# Patient Record
Sex: Female | Born: 1995 | Race: White | Hispanic: No | Marital: Single | Smoking: Never smoker
Health system: Southern US, Community
[De-identification: ages and names within clinical notes are randomized; demographics above are authoritative.]

## PROBLEM LIST (undated history)

## (undated) DIAGNOSIS — T7840XA Allergy, unspecified, initial encounter: Secondary | ICD-10-CM

## (undated) DIAGNOSIS — N12 Tubulo-interstitial nephritis, not specified as acute or chronic: Secondary | ICD-10-CM

## (undated) DIAGNOSIS — N390 Urinary tract infection, site not specified: Secondary | ICD-10-CM

## (undated) HISTORY — DX: Allergy, unspecified, initial encounter: T78.40XA

## (undated) HISTORY — DX: Tubulo-interstitial nephritis, not specified as acute or chronic: N12

## (undated) HISTORY — DX: Urinary tract infection, site not specified: N39.0

---

## 2014-07-22 ENCOUNTER — Emergency Department (HOSPITAL_COMMUNITY)
Admission: EM | Admit: 2014-07-22 | Discharge: 2014-07-22 | Disposition: A | Discharge status: Home / Self Care | Payer: BLUE CROSS/BLUE SHIELD | Source: Home / Self Care | Attending: Emergency Medicine | Admitting: Emergency Medicine

## 2014-07-22 ENCOUNTER — Encounter (HOSPITAL_COMMUNITY): Payer: Self-pay

## 2014-07-22 DIAGNOSIS — N12 Tubulo-interstitial nephritis, not specified as acute or chronic: Secondary | ICD-10-CM

## 2014-07-22 LAB — POCT URINALYSIS DIP (DEVICE)
Bilirubin Urine: NEGATIVE
GLUCOSE, UA: NEGATIVE mg/dL
KETONES UR: 40 mg/dL — AB
NITRITE: POSITIVE — AB
PH: 5.5 (ref 5.0–8.0)
Protein, ur: 30 mg/dL — AB
Specific Gravity, Urine: 1.025 (ref 1.005–1.030)
Urobilinogen, UA: 0.2 mg/dL (ref 0.0–1.0)

## 2014-07-22 MED ORDER — CIPROFLOXACIN HCL 500 MG PO TABS: 500.0000 mg | ORAL_TABLET | Freq: Two times a day (BID) | ORAL | Status: DC

## 2014-07-22 MED ORDER — LIDOCAINE HCL (PF) 2 % IJ SOLN
INTRAMUSCULAR | Status: AC
  Filled 2014-07-22: qty 2

## 2014-07-22 MED ORDER — CEFTRIAXONE SODIUM 1 G IJ SOLR
1.0000 g | Freq: Once | INTRAMUSCULAR | Status: AC
  Administered 2014-07-22: 1 g via INTRAMUSCULAR

## 2014-07-22 MED ORDER — TRAMADOL HCL 50 MG PO TABS: 50.0000 mg | ORAL_TABLET | Freq: Four times a day (QID) | ORAL | Status: DC | PRN

## 2014-07-22 MED ORDER — CEFTRIAXONE SODIUM 1 G IJ SOLR
INTRAMUSCULAR | Status: AC
  Filled 2014-07-22: qty 10

## 2014-07-22 NOTE — Discharge Instructions (Signed)
Pyelonephritis, Adult °Pyelonephritis is a kidney infection. In general, there are 2 main types of pyelonephritis: °· Infections that come on quickly without any warning (acute pyelonephritis). °· Infections that persist for a long period of time (chronic pyelonephritis). °CAUSES  °Two main causes of pyelonephritis are: °· Bacteria traveling from the bladder to the kidney. This is a problem especially in pregnant women. The urine in the bladder can become filled with bacteria from multiple causes, including: °¨ Inflammation of the prostate gland (prostatitis). °¨ Sexual intercourse in females. °¨ Bladder infection (cystitis). °· Bacteria traveling from the bloodstream to the tissue part of the kidney. °Problems that may increase your risk of getting a kidney infection include: °· Diabetes. °· Kidney stones or bladder stones. °· Cancer. °· Catheters placed in the bladder. °· Other abnormalities of the kidney or ureter. °SYMPTOMS  °· Abdominal pain. °· Pain in the side or flank area. °· Fever. °· Chills. °· Upset stomach. °· Blood in the urine (dark urine). °· Frequent urination. °· Strong or persistent urge to urinate. °· Burning or stinging when urinating. °DIAGNOSIS  °Your caregiver may diagnose your kidney infection based on your symptoms. A urine sample may also be taken. °TREATMENT  °In general, treatment depends on how severe the infection is.  °· If the infection is mild and caught early, your caregiver may treat you with oral antibiotics and send you home. °· If the infection is more severe, the bacteria may have gotten into the bloodstream. This will require intravenous (IV) antibiotics and a hospital stay. Symptoms may include: °¨ High fever. °¨ Severe flank pain. °¨ Shaking chills. °· Even after a hospital stay, your caregiver may require you to be on oral antibiotics for a period of time. °· Other treatments may be required depending upon the cause of the infection. °HOME CARE INSTRUCTIONS  °· Take your  antibiotics as directed. Finish them even if you start to feel better. °· Make an appointment to have your urine checked to make sure the infection is gone. °· Drink enough fluids to keep your urine clear or pale yellow. °· Take medicines for the bladder if you have urgency and frequency of urination as directed by your caregiver. °SEEK IMMEDIATE MEDICAL CARE IF:  °· You have a fever or persistent symptoms for more than 2-3 days. °· You have a fever and your symptoms suddenly get worse. °· You are unable to take your antibiotics or fluids. °· You develop shaking chills. °· You experience extreme weakness or fainting. °· There is no improvement after 2 days of treatment. °MAKE SURE YOU: °· Understand these instructions. °· Will watch your condition. °· Will get help right away if you are not doing well or get worse. °Document Released: 05/04/2005 Document Revised: 11/03/2011 Document Reviewed: 10/08/2010 °ExitCare® Patient Information ©2015 [employer]. This information is not intended to replace advice given to you by your health care provider. Make sure you discuss any questions you have with your health care provider. ° °

## 2014-07-22 NOTE — ED Provider Notes (Signed)
CSN: [id number]     Arrival date & time 07/22/14  1511 History   First MD Initiated Contact with Patient 07/22/14 1639     Chief Complaint  Patient presents with  . Abdominal Pain   (Consider location/radiation/quality/duration/timing/severity/associated sxs/prior Treatment) HPI        19 year old female presents complaining of urinary frequency, headache, severe back pain, abdominal pain. This started as dysuria about 2 weeks ago. This got better after a few days. One week ago she started to have abdominal and back pain that has worsened up until today. The pain is in her lower abdomen and on the left side, radiating up into her left flank. She also has diffuse myalgias. She has mild nausea without vomiting. She has never had this before and has no history of UTI or pyelonephritis.  History reviewed. No pertinent past medical history. History reviewed. No pertinent past surgical history. No family history on file. History  Substance Use Topics  . Smoking status: Not on file  . Smokeless tobacco: Not on file  . Alcohol Use: Not on file   OB History    No data available     Review of Systems  Constitutional: Positive for chills.  HENT: Negative for congestion, sinus pressure and sore throat.   Eyes: Negative for visual disturbance.  Respiratory: Negative for chest tightness and shortness of breath.   Cardiovascular: Negative for chest pain.  Gastrointestinal: Positive for abdominal pain. Negative for nausea, vomiting and diarrhea.  Genitourinary: Positive for urgency, frequency and flank pain. Negative for dysuria, hematuria, vaginal bleeding and vaginal discharge.  Musculoskeletal: Positive for myalgias.  Skin: Negative for rash.  All other systems reviewed and are negative.   Allergies  Review of patient's allergies indicates no known allergies.  Home Medications   Prior to Admission medications   Medication Sig Start Date End Date Taking? Authorizing Provider   ciprofloxacin (CIPRO) 500 MG tablet Take 1 tablet (500 mg total) by mouth every 12 (twelve) hours. 07/22/14   Graylon GoodZachary H Pang Robers, PA-C  traMADol (ULTRAM) 50 MG tablet Take 1 tablet (50 mg total) by mouth every 6 (six) hours as needed for moderate pain. 07/22/14   Adrian BlackwaterZachary H Elzabeth Mcquerry, PA-C   BP 109/69 mmHg  Pulse 87  Temp(Src) 98.1 F (36.7 C) (Oral)  Resp 16  SpO2 100% Physical Exam  Constitutional: She is oriented to person, place, and time. Vital signs are normal. She appears well-developed and well-nourished. No distress.  HENT:  Head: Normocephalic and atraumatic.  Cardiovascular: Normal rate, regular rhythm and normal heart sounds.   Pulmonary/Chest: Effort normal and breath sounds normal. No respiratory distress.  Abdominal: Soft. Normal appearance. She exhibits no mass. There is no hepatosplenomegaly. There is tenderness in the suprapubic area and left lower quadrant. There is no rigidity, no rebound, no guarding and no CVA tenderness.  Neurological: She is alert and oriented to person, place, and time. She has normal strength. Coordination normal.  Skin: Skin is warm and dry. No rash noted. She is not diaphoretic.  Psychiatric: She has a normal mood and affect. Judgment normal.  Nursing note and vitals reviewed.   ED Course  Procedures (including critical care time) Labs Review Labs Reviewed  POCT URINALYSIS DIP (DEVICE) - Abnormal; Notable for the following:    Ketones, ur 40 (*)    Hgb urine dipstick MODERATE (*)    Protein, ur 30 (*)    Nitrite POSITIVE (*)    Leukocytes, UA MODERATE (*)    All  other components within normal limits  URINE CULTURE    Imaging Review No results found.   MDM   1. Pyelonephritis    She has pyelonephritis. She has been given an injection of 1 g of ceftriaxone here and discharged with Cipro 500 mg twice a day for 10 days as well as tramadol for pain. She will go to the emergency department if worsening. A urine culture has been sent  Meds  ordered this encounter  Medications  . cefTRIAXone (ROCEPHIN) injection 1 g    Sig:   . ciprofloxacin (CIPRO) 500 MG tablet    Sig: Take 1 tablet (500 mg total) by mouth every 12 (twelve) hours.    Dispense:  20 tablet    Refill:  0    Order Specific Question:  Supervising Provider    Answer:  Linna Hoff [phone]  . traMADol (ULTRAM) 50 MG tablet    Sig: Take 1 tablet (50 mg total) by mouth every 6 (six) hours as needed for moderate pain.    Dispense:  15 tablet    Refill:  0    Order Specific Question:  Supervising Provider    Answer:  Bradd Canary D [[id number]]       Graylon Good, PA-C 07/22/14 [phone]

## 2014-07-22 NOTE — ED Notes (Signed)
Patient complains of abd pain-Stabbing pain that started about a week ago Along with frequent urination Complains of headache, and back pain as well

## 2014-07-25 LAB — URINE CULTURE: Colony Count: >=100000

## 2014-07-30 NOTE — ED Notes (Signed)
Urine culture: >100,000 colonies E. Coli.  Pt. adequately treated with Cipro. Vassie MoselleYork, Jhoel Stieg M 07/30/2014

## 2015-12-16 LAB — HM PAP SMEAR: HM PAP: NORMAL

## 2016-06-18 ENCOUNTER — Telehealth: Payer: Self-pay | Admitting: *Deleted

## 2016-06-18 ENCOUNTER — Telehealth: Payer: Self-pay | Admitting: Physician Assistant

## 2016-06-18 ENCOUNTER — Encounter: Payer: Self-pay | Admitting: Physician Assistant

## 2016-06-18 ENCOUNTER — Other Ambulatory Visit: Payer: Self-pay | Admitting: Physician Assistant

## 2016-06-18 ENCOUNTER — Ambulatory Visit (INDEPENDENT_AMBULATORY_CARE_PROVIDER_SITE_OTHER): Payer: BLUE CROSS/BLUE SHIELD | Admitting: Physician Assistant

## 2016-06-18 ENCOUNTER — Ambulatory Visit (INDEPENDENT_AMBULATORY_CARE_PROVIDER_SITE_OTHER): Payer: BLUE CROSS/BLUE SHIELD

## 2016-06-18 VITALS — BP 98/70 | HR 88 | Temp 99.2°F | Resp 18 | Ht 60.5 in | Wt 120.4 lb

## 2016-06-18 DIAGNOSIS — R05 Cough: Secondary | ICD-10-CM

## 2016-06-18 DIAGNOSIS — R059 Cough, unspecified: Secondary | ICD-10-CM

## 2016-06-18 DIAGNOSIS — Z0000 Encounter for general adult medical examination without abnormal findings: Secondary | ICD-10-CM

## 2016-06-18 DIAGNOSIS — Z0001 Encounter for general adult medical examination with abnormal findings: Secondary | ICD-10-CM

## 2016-06-18 DIAGNOSIS — J45991 Cough variant asthma: Secondary | ICD-10-CM | POA: Insufficient documentation

## 2016-06-18 LAB — COMPREHENSIVE METABOLIC PANEL
ALK PHOS: 33 U/L — AB (ref 39–117)
ALT: 12 U/L (ref 0–35)
AST: 15 U/L (ref 0–37)
Albumin: 4.1 g/dL (ref 3.5–5.2)
BILIRUBIN TOTAL: 0.7 mg/dL (ref 0.2–1.2)
BUN: 10 mg/dL (ref 6–23)
CO2: 27 mEq/L (ref 19–32)
Calcium: 9.2 mg/dL (ref 8.4–10.5)
Chloride: 105 mEq/L (ref 96–112)
Creatinine, Ser: 0.58 mg/dL (ref 0.40–1.20)
GFR: 138.58 mL/min (ref >60.00)
Glucose, Bld: 99 mg/dL (ref 70–99)
Potassium: 3.5 mEq/L (ref 3.5–5.1)
Sodium: 138 mEq/L (ref 135–145)
TOTAL PROTEIN: 7.5 g/dL (ref 6.0–8.3)

## 2016-06-18 LAB — CBC WITH DIFFERENTIAL/PLATELET
BASOS ABS: 0 10*3/uL (ref 0.0–0.1)
Basophils Relative: 0.6 % (ref 0.0–3.0)
EOS PCT: 2 % (ref 0.0–5.0)
Eosinophils Absolute: 0.1 10*3/uL (ref 0.0–0.7)
HCT: 39.5 % (ref 36.0–46.0)
Hemoglobin: 13.4 g/dL (ref 12.0–15.0)
Lymphocytes Relative: 48.4 % — ABNORMAL HIGH (ref 12.0–46.0)
Lymphs Abs: 3.4 10*3/uL (ref 0.7–4.0)
MCHC: 33.8 g/dL (ref 30.0–36.0)
MCV: 90.9 fl (ref 78.0–100.0)
Monocytes Absolute: 0.3 10*3/uL (ref 0.1–1.0)
Monocytes Relative: 5 % (ref 3.0–12.0)
NEUTROS ABS: 3.1 10*3/uL (ref 1.4–7.7)
Neutrophils Relative %: 44 % (ref 43.0–77.0)
Platelets: 225 10*3/uL (ref 150.0–400.0)
RBC: 4.35 Mil/uL (ref 3.87–5.11)
RDW: 13.4 % (ref 11.5–15.5)
WBC: 7 10*3/uL (ref 4.0–10.5)

## 2016-06-18 LAB — LIPID PANEL
Cholesterol: 191 mg/dL (ref 0–200)
HDL: 46.7 mg/dL (ref >39.00)
LDL Cholesterol: 119 mg/dL — ABNORMAL HIGH (ref 0–99)
NonHDL: 144.68
Total CHOL/HDL Ratio: 4
Triglycerides: 130 mg/dL (ref 0.0–149.0)
VLDL: 26 mg/dL (ref 0.0–40.0)

## 2016-06-18 MED ORDER — OMEPRAZOLE 40 MG PO CPDR: 40.0000 mg | DELAYED_RELEASE_CAPSULE | Freq: Every day | ORAL | 0 refills | Status: DC

## 2016-06-18 MED ORDER — FLUTICASONE PROPIONATE 50 MCG/ACT NA SUSP: 2.0000 | Freq: Every day | NASAL | 1 refills | Status: DC

## 2016-06-18 MED ORDER — BECLOMETHASONE DIPROPIONATE 40 MCG/ACT IN AERS: 1.0000 | INHALATION_SPRAY | Freq: Two times a day (BID) | RESPIRATORY_TRACT | 1 refills | Status: DC

## 2016-06-18 NOTE — Telephone Encounter (Signed)
Paperwork received from patient and [facility] ([phone]) and [facility] ([phone]).

## 2016-06-18 NOTE — Progress Notes (Signed)
Pre visit review using our clinic review tool, if applicable. No additional management support is needed unless otherwise documented below in the visit note. 

## 2016-06-18 NOTE — Telephone Encounter (Signed)
Pt called and had questions about medications that she was started on.  Went over medications with pt. Pt verbalized understanding.

## 2016-06-18 NOTE — Patient Instructions (Addendum)
It was great meeting you today!  Please go to the lab to complete your labwork and chest xray.  We will call you with results.  Follow-up with me in two weeks for your cough, sooner if your cough worsens or you develop fever/chills or other worsening symptoms. Take the omeprazole daily before meals. Use the nasal spray as directed.   Cough, Adult Coughing is a reflex that clears your throat and your airways. Coughing helps to heal and protect your lungs. It is normal to cough occasionally, but a cough that happens with other symptoms or lasts a long time may be a sign of a condition that needs treatment. A cough may last only 2-3 weeks (acute), or it may last longer than 8 weeks (chronic). What are the causes? Coughing is commonly caused by:  Breathing in substances that irritate your lungs.  A viral or bacterial respiratory infection.  Allergies.  Asthma.  Postnasal drip.  Smoking.  Acid backing up from the stomach into the esophagus (gastroesophageal reflux).  Certain medicines.  Chronic lung problems, including COPD (or rarely, lung cancer).  Other medical conditions such as heart failure. Follow these instructions at home: Pay attention to any changes in your symptoms. Take these actions to help with your discomfort:  Take medicines only as told by your health care provider.  If you were prescribed an antibiotic medicine, take it as told by your health care provider. Do not stop taking the antibiotic even if you start to feel better.  Talk with your health care provider before you take a cough suppressant medicine.  Drink enough fluid to keep your urine clear or pale yellow.  If the air is dry, use a cold steam vaporizer or humidifier in your bedroom or your home to help loosen secretions.  Avoid anything that causes you to cough at work or at home.  If your cough is worse at night, try sleeping in a semi-upright position.  Avoid cigarette smoke. If you smoke, quit  smoking. If you need help quitting, ask your health care provider.  Avoid caffeine.  Avoid alcohol.  Rest as needed. Contact a health care provider if:  You have new symptoms.  You cough up pus.  Your cough does not get better after 2-3 weeks, or your cough gets worse.  You cannot control your cough with suppressant medicines and you are losing sleep.  You develop pain that is getting worse or pain that is not controlled with pain medicines.  You have a fever.  You have unexplained weight loss.  You have night sweats. Get help right away if:  You cough up blood.  You have difficulty breathing.  Your heartbeat is very fast. This information is not intended to replace advice given to you by your health care provider. Make sure you discuss any questions you have with your health care provider. Document Released: 10/31/2010 Document Revised: 10/10/2015 Document Reviewed: 07/11/2014 Elsevier Interactive Patient Education  2017 [employer]

## 2016-06-18 NOTE — Progress Notes (Signed)
Subjective:    Patient ID: Hannah Donaldson, female    DOB: [birth year], 21 y.o.   MRN: [id number]  HPI  Hannah Donaldson is a 21 y/o female who presents to clinic today to establish care.  Acute Concerns: Cough - on and off for years; since [employer] School. Mostly at night time, but now she finds that it is now occurring during the day. She has had some work-up at her pediatrics office for this, and over the course of her work-up she recollects trying a medication for asthma, allergy, and GERD. She said that she even went to ENT for this, and they started her on zyrtec and told her if it didn't improve she may need a chest xray. She states that she did not follow up with them. She does state that she did not take the medicines as prescribed because she was younger and didn't really make a priority to do so. She endorses a mostly dry cough, but it can be mucuosy at times. She denies shortness of breath with exercise. No night sweats, no fevers. No specific triggers, she denies specific allergens, cold weather, exercise.  Chronic Issues: none  Health Maintenance: Immunizations -- says she us up to date, we have requested records PAP -- has a gynecologist, has been using NuvaRing for four months, states that it is working well for her, periods last about 4 days and are much lighter now that she is on NuvaRing Diet -- doesn't really follow any certain plan, eats whatever she likes Exercise -- go to gym occasionally, works with dogs on the weekend Vision - went last year, wears glasses Dentist - went a few weeks ago  Other providers/specialists: Theatre managerGynecologist - [facility] Tr[facility]  Current wt: 120, normal for her   Review of Systems  Constitutional: Negative for appetite change and fever.  HENT: Negative for ear pain and sinus pressure.   Eyes: Negative for visual disturbance.  Respiratory: Negative for chest tightness and shortness of breath.   Cardiovascular: Negative for  chest pain and leg swelling.  Gastrointestinal: Negative for diarrhea, nausea and vomiting.  Genitourinary: Negative for pelvic pain and vaginal discharge.  Musculoskeletal: Negative for myalgias and neck pain.  Neurological: Negative for dizziness, numbness and headaches.  Hematological: Negative for adenopathy.  Psychiatric/Behavioral:       No issues with depression or anxiety     Past Medical History:  Diagnosis Date  . Allergy    Seasonal     Social History   Social History  . Marital status: Single    Spouse name: N/A  . Number of children: N/A  . Years of education: N/A   Occupational History  . Not on file.   Social History Main Topics  . Smoking status: Never Smoker  . Smokeless tobacco: Never Used  . Alcohol use 0.6 - 1.2 oz/week    1 - 2 Shots of liquor per week     Comment: once a month  . Drug use: No  . Sexual activity: Yes    Birth control/ protection: Inserts     Comment: Nuvaring   Other Topics Concern  . Not on file   Social History Narrative  . No narrative on file    History reviewed. No pertinent surgical history.  History reviewed. No pertinent family history.  No Known Allergies  No current outpatient prescriptions on file prior to visit.   No current facility-administered medications on file prior to visit.  BP 98/70 (BP Location: Left Arm, Patient Position: Sitting, Cuff Size: Normal)   Pulse 88   Temp 99.2 F (37.3 C) (Oral)   Resp 18   Ht 5' 0.5" (1.537 m)   Wt 120 lb 6.1 oz (54.6 kg)   LMP 05/28/2016 (Exact Date)   SpO2 98%   BMI 23.12 kg/m       Objective:   Physical Exam  Constitutional: Vital signs are normal. She is cooperative.  HENT:  Head: Normocephalic and atraumatic.  Right Ear: Tympanic membrane, external ear and ear canal normal. Tympanic membrane is not erythematous, not retracted and not bulging.  Left Ear: Tympanic membrane, external ear and ear canal normal. Tympanic membrane is not erythematous,  not retracted and not bulging.  Nose: Nose normal. Right sinus exhibits no maxillary sinus tenderness and no frontal sinus tenderness. Left sinus exhibits no maxillary sinus tenderness and no frontal sinus tenderness.  Mouth/Throat: Uvula is midline and oropharynx is clear and moist. No posterior oropharyngeal edema or posterior oropharyngeal erythema.  Cardiovascular: Normal rate, regular rhythm, normal heart sounds and normal pulses.   Pulmonary/Chest: Effort normal and breath sounds normal. No accessory muscle usage. No respiratory distress. She has no decreased breath sounds. She has no wheezes. She has no rhonchi. She has no rales.  Abdominal: Normal appearance. There is no tenderness.  Genitourinary:  Genitourinary Comments: Deferred, has Ob-Gyn  Lymphadenopathy:    She has no cervical adenopathy.  Neurological: She is alert. No cranial nerve deficit or sensory deficit. GCS eye subscore is 4. GCS verbal subscore is 5. GCS motor subscore is 6.  Skin: Skin is warm, dry and intact.  Psychiatric: She has a normal mood and affect.  Nursing note and vitals reviewed.     Assessment & Plan:   Problem List Items Addressed This Visit      Other   Cough    Chronic cough x years. Try omeprazole 40 mg daily and flonase. Chest xray today. Follow-up in two weeks. Consider pulmonology consult if no improvement.      Relevant Orders   DG Chest 2 View (Completed)    Other Visit Diagnoses    Preventative health care    -  Primary Reviewed healthy eating, exercise, and age specific recommendations for health maintenance including limiting alcohol, safe sex practices, and immunizations. Ordered the following labs: CBC, CMP, lipid panel. Will further evaluate and treat based on lab results.   Relevant Orders   CBC with Differential/Platelet   Comprehensive metabolic panel   Lipid panel     Jarold Motto PA-C 06/18/16

## 2016-06-18 NOTE — Telephone Encounter (Signed)
Error

## 2016-06-18 NOTE — Assessment & Plan Note (Signed)
Chronic cough x years. Try omeprazole 40 mg daily and flonase. Chest xray today. Follow-up in two weeks. Consider pulmonology consult if no improvement.

## 2016-06-19 ENCOUNTER — Encounter: Payer: Self-pay | Admitting: Physician Assistant

## 2016-06-22 ENCOUNTER — Other Ambulatory Visit (INDEPENDENT_AMBULATORY_CARE_PROVIDER_SITE_OTHER): Payer: BLUE CROSS/BLUE SHIELD

## 2016-06-22 ENCOUNTER — Encounter: Payer: Self-pay | Admitting: Internal Medicine

## 2016-06-22 ENCOUNTER — Ambulatory Visit (INDEPENDENT_AMBULATORY_CARE_PROVIDER_SITE_OTHER): Payer: BLUE CROSS/BLUE SHIELD | Admitting: Internal Medicine

## 2016-06-22 VITALS — BP 120/72 | HR 90 | Ht 60.5 in | Wt 120.0 lb

## 2016-06-22 DIAGNOSIS — R05 Cough: Secondary | ICD-10-CM

## 2016-06-22 DIAGNOSIS — R059 Cough, unspecified: Secondary | ICD-10-CM

## 2016-06-22 LAB — SEDIMENTATION RATE: Sed Rate: 12 mm/hr (ref 0–20)

## 2016-06-22 MED ORDER — MONTELUKAST SODIUM 10 MG PO TABS: 10.0000 mg | ORAL_TABLET | Freq: Every day | ORAL | 11 refills | Status: DC

## 2016-06-22 MED ORDER — FAMOTIDINE 20 MG PO TABS: ORAL_TABLET | ORAL | 11 refills | Status: DC

## 2016-06-22 NOTE — Progress Notes (Signed)
Subjective:     Patient ID: Hannah Donaldson, female   DOB: 10/17/1995,    MRN: [id number]  HPI   21 yo female never smoker with pattern of recurrent cough since living in [place] developed in [place] resolved p ped rx but recurred every year or two since moved to [place] in the 9th grade then eval ENT in [place] at 12 th grade ? Jerrye Bushy? Asthma > no inhalers made it better, no better on zyrtec and never took gerd rx consistently so referred to pulmonary clinic 06/22/2016 by Dr   Morene Rankins with recurrent cough since around the Dec 2018     06/22/2016 1st [facility] office visit/ Masaichi Kracht   Chief Complaint  Patient presents with  . Pulmonary Consult    Referred by Inda Coke. Pt c/o cough x 8 years, worse for the past 2 months. Her cough is prod with clear to yellow sputum.  Cough has been waking her up at night.  She states that she gets CP and occ HA after long coughing spells.   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 No unusual exp hx or h/o childhood pna/ asthma or knowledge of premature birth.  Sleeping ok without nocturnal  or early am exacerbation  of respiratory  c/o's or need for noct saba. Also denies any obvious fluctuation of symptoms with weather or environmental changes or other aggravating or alleviating factors except as outlined above   Current Medications, Allergies, Complete Past Medical History, Past Surgical  History, Family History, and Social History were reviewed in [employer] record.  ROS  The following are not active complaints unless bolded sore throat, dysphagia, dental problems, itching, sneezing,  nasal congestion or excess/ purulent secretions, ear ache,   fever, chills, sweats, unintended wt loss, classically pleuritic or exertional cp,  orthopnea pnd or leg swelling, presyncope, palpitations, abdominal pain, anorexia, nausea, vomiting, diarrhea  or change in bowel or bladder habits, change in stools or urine, dysuria,hematuria,  rash, arthralgias, visual complaints, headache, numbness, weakness or ataxia or problems with walking or coordination,  change in mood/affect or memory.              Review of Systems     Objective:   Physical Exam  amb pleasant female   Wt Readings from Last 3 Encounters:  06/22/16 120 lb (54.4 kg)  06/18/16 120 lb 6.1 oz (54.6 kg)    Vital signs reviewed  - Note on arrival 02 sats  98% on RA     HEENT: nl dentition,   and oropharynx. Nl external ear canals without cough reflex- mild to moderate bilateral non-specific turbinate edema     NECK :  without JVD/Nodes/TM/ nl carotid upstrokes bilaterally   LUNGS: no acc muscle use,  Nl contour chest which is clear to A and P bilaterally without cough on insp  or exp maneuvers   CV:  RRR  no s3 or murmur or increase in P2, nad no edema   ABD:  soft and nontender with nl inspiratory excursion in the supine position. No bruits or organomegaly appreciated, bowel sounds nl  MS:  Nl gait/ ext warm without deformities, calf tenderness, cyanosis or clubbing No obvious joint restrictions   SKIN: warm and dry without lesions    NEURO:  alert, approp, nl sensorium with  no motor or cerebellar deficits apparent.      I personally reviewed images and agree with radiology impression as follows:  CXR:   06/18/16 Increased interstitial lung markings which may reflect  interstitial pneumonia or reactive airway disease with superimposed acute bronchitis. There is no alveolar pneumonia nor CHF. My impression: no evidence of ILD   Labs ordered 06/22/2016   ESR/ allergy profile      Assessment:

## 2016-06-22 NOTE — Patient Instructions (Addendum)
Please remember to go to the lab   department downstairs in the basement  for your tests - we will call you with the results when they are available.   For drainage / throat tickle try take CHLORPHENIRAMINE  4 mg - take one every 4 hours as needed - available over the counter- may cause drowsiness so start with just a bedtime dose or two and see how you tolerate it before trying in daytime    singulair montelukast 10 mg along with Pepcid 20 mg at bedtime every night   GERD (REFLUX)  is an extremely common cause of respiratory symptoms just like yours , many times with no obvious heartburn at all.    It can be treated with medication, but also with lifestyle changes including elevation of the head of your bed (ideally with 6 inch  bed blocks),  Smoking cessation, avoidance of late meals, excessive alcohol, and avoid fatty foods, chocolate, peppermint, colas, red wine, and acidic juices such as orange juice.  NO MINT OR MENTHOL PRODUCTS SO NO COUGH DROPS   USE SUGARLESS CANDY INSTEAD (Jolley ranchers or Stover's or Life Savers) or even ice chips will also do - the key is to swallow to prevent all throat clearing. NO OIL BASED VITAMINS - use powdered substitutes.  Please schedule a follow up office visit in 2  weeks, sooner if needed

## 2016-06-23 LAB — RESPIRATORY ALLERGY PROFILE REGION II ~~LOC~~
Allergen, A. alternata, m6: <0.1 kU/L
Allergen, C. Herbarum, M2: 2.81 kU/L — ABNORMAL HIGH
Allergen, Cedar tree, t12: <0.1 kU/L
Allergen, D pternoyssinus,d7: 0.24 kU/L — ABNORMAL HIGH
Allergen, Mouse Urine Protein, e78: <0.1 kU/L
Allergen, Mulberry, t76: <0.1 kU/L
Allergen, Oak,t7: <0.1 kU/L
Allergen, P. notatum, m1: <0.1 kU/L
Aspergillus fumigatus, m3: <0.1 kU/L
Box Elder IgE: <0.1 kU/L
Cat Dander: 0.59 kU/L — ABNORMAL HIGH
Cockroach: 0.23 kU/L — ABNORMAL HIGH
Common Ragweed: <0.1 kU/L
D. farinae: 0.2 kU/L — ABNORMAL HIGH
Dog Dander: 73.7 kU/L — ABNORMAL HIGH
IGE (IMMUNOGLOBULIN E), SERUM: 667 kU/L — AB (ref <115)
Johnson Grass: <0.1 kU/L
Pecan/Hickory Tree IgE: <0.1 kU/L
Rough Pigweed  IgE: <0.1 kU/L
Sheep Sorrel IgE: <0.1 kU/L
Timothy Grass: <0.1 kU/L

## 2016-06-23 NOTE — Progress Notes (Signed)
Medical screening examination/treatment/procedure(s) were performed by non-physician practitioner and as supervising physician I was immediately available for consultation/collaboration. I agree with above. Percy Winterrowd, DO   

## 2016-06-23 NOTE — Progress Notes (Signed)
Spoke with pt and notified of results per Dr. Wert. Pt verbalized understanding and denied any questions. 

## 2016-06-23 NOTE — Assessment & Plan Note (Signed)
The most common causes of chronic cough in immunocompetent adults include the following: upper airway cough syndrome (UACS), previously referred to as postnasal drip syndrome (PNDS), which is caused by variety of rhinosinus conditions; (2) asthma; (3) GERD; (4) chronic bronchitis from cigarette smoking or other inhaled environmental irritants; (5) nonasthmatic eosinophilic bronchitis; and (6) bronchiectasis.   These conditions, singly or in combination, have accounted for up to 94% of the causes of chronic cough in prospective studies.   Other conditions have constituted no >6% of the causes in prospective studies These have included bronchogenic carcinoma, chronic interstitial pneumonia, sarcoidosis, left ventricular failure, ACEI-induced cough, and aspiration from a condition associated with pharyngeal dysfunction.    Chronic cough is often simultaneously caused by more than one condition. A single cause has been found from 38 to 82% of the time, multiple causes from 18 to 62%. Multiply caused cough has been the result of three diseases up to 42% of the time.       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   These are the same pts (now being labeled as having "irritable larynx syndrome" by some cough centers) who not infrequently have a history of having failed to tolerate ace inhibitors,  dry powder inhalers or biphosphonates or report having atypical/extraesophageal reflux symptoms that don't respond to standard  doses of PPI  and are easily confused as having aecopd or asthma flares by even experienced allergists/ pulmonologists (myself included).  Will start with singulair / pepcid at hs and avoid prednisone empirically for now noting that "inhalers" never worked before.  Also will try adding 1st gen h1 hs as per guidelines for uacs   If dx remains in doubt with need sinus CT and methacholine challenge testing next   Total time devoted to counseling  > 50 % of initial 60 min office visit:  review case with pt/ discussion of options/alternatives/ personally creating written customized instructions  in presence of pt  then going over those specific  Instructions directly with the pt including how to use all of the meds but in particular covering each new medication in detail and the difference between the maintenance= "automatic" meds and the prns using an action plan format for the latter (If this problem/symptom => do that organization reading Left to right).  Please see AVS from this visit for a full list of these instructions which I personally wrote for this pt and  are unique to this visit.

## 2016-06-29 ENCOUNTER — Telehealth: Payer: Self-pay | Admitting: Physician Assistant

## 2016-06-29 NOTE — Telephone Encounter (Signed)
Error

## 2016-07-02 ENCOUNTER — Ambulatory Visit: Payer: BLUE CROSS/BLUE SHIELD | Admitting: Physician Assistant

## 2016-07-06 ENCOUNTER — Encounter: Payer: Self-pay | Admitting: Internal Medicine

## 2016-07-06 ENCOUNTER — Ambulatory Visit (INDEPENDENT_AMBULATORY_CARE_PROVIDER_SITE_OTHER): Payer: BLUE CROSS/BLUE SHIELD | Admitting: Internal Medicine

## 2016-07-06 VITALS — BP 110/62 | HR 80 | Ht 60.5 in | Wt 123.6 lb

## 2016-07-06 DIAGNOSIS — J45991 Cough variant asthma: Secondary | ICD-10-CM

## 2016-07-06 DIAGNOSIS — R05 Cough: Secondary | ICD-10-CM

## 2016-07-06 DIAGNOSIS — R059 Cough, unspecified: Secondary | ICD-10-CM

## 2016-07-06 MED ORDER — BUDESONIDE-FORMOTEROL FUMARATE 80-4.5 MCG/ACT IN AERO: 2.0000 | INHALATION_SPRAY | Freq: Two times a day (BID) | RESPIRATORY_TRACT | 0 refills | Status: DC

## 2016-07-06 MED ORDER — BUDESONIDE-FORMOTEROL FUMARATE 80-4.5 MCG/ACT IN AERO: 2.0000 | INHALATION_SPRAY | Freq: Two times a day (BID) | RESPIRATORY_TRACT | 11 refills | Status: DC

## 2016-07-06 NOTE — Assessment & Plan Note (Signed)
Max rx for gerd 06/22/2016 >>>  - Singulair added 06/22/2016  - Allergy profile 06/22/16  >  Eos 0.1/  IgE  667 RAST strongly  POS Dog > cat/dust  - Spirometry 07/06/2016  wnl though f/v loop suggest poor effort dep portion - 07/06/2016  After extensive coaching HFA effectiveness =    90% > try symb 80 2bid    Elimination of all noct symptoms by h1/h2 against asthma as is lack of cough with exp to dogs so this is either allergic rhinitis with pnds or eos bronchitis or cough variant asthma and best option is empirical trial of symb 80 since she did so well with training today   If better can drop the daytime ppi/ and if not better should stop both ppi and symb and let us refer her to allergy/ Dr Lucie LeatherKozlow  I had an extended discussion with the patient reviewing all relevant studies completed to date and  lasting 15 to 20 minutes of a 25 minute visit    Each maintenance medication was reviewed in detail including most importantly the difference between maintenance and prns and under what circumstances the prns are to be triggered using an action plan format that is not reflected in the computer generated alphabetically organized AVS.    Please see AVS for specific instructions unique to this visit that I personally wrote and verbalized to the the pt in detail and then reviewed with pt  by my nurse highlighting any  changes in therapy recommended at today's visit to their plan of care.

## 2016-07-06 NOTE — Patient Instructions (Addendum)
Start symbicort 80 Take 2 puffs first thing in am and then another 2 puffs about 12 hours later  Work on inhaler technique:  relax and gently blow all the way out then take a nice smooth deep breath back in, triggering the inhaler at same time you start breathing in.  Hold for up to 5 seconds if you can. Blow out thru nose. Rinse and gargle with water when done   Try daytime chlorpheniramine as needed for drainage/ throat tickle to see if it helps without making you sleepy   If better in 2 weeks, continue symbicort and stop just the prilosec and see me in  3 months   If no better, stop both the symbicort and the prilosec and call me for allergy referral

## 2016-07-06 NOTE — Progress Notes (Signed)
Subjective:     Patient ID: Hannah Donaldson, female   DOB: [birth year],    MRN: [id number]    Brief patient profile:  21 yo female never smoker with pattern of recurrent cough since living in [place] developed in [employer] resolved p ped rx but recurred every year or two since moved to [place] in the 9th grade then eval ENT in [place] at 12 th grade ? Hannah Donaldson? Asthma > no inhalers made it better, no better on zyrtec and never took gerd rx consistently so referred to pulmonary clinic 06/22/2016 by Dr   Hannah Donaldson with recurrent cough since around the Dec 2017    History of Present Illness  06/22/2016 1st  Pulmonary office visit/ Hannah Donaldson   Chief Complaint  Patient presents with  . Pulmonary Consult    Referred by Hannah Donaldson. Pt c/o cough x 8 years, worse for the past 2 months. Her cough is prod with clear to yellow sputum.  Cough has been waking her up at night.  She states that she gets CP and occ HA after long coughing spells.   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 For drainage / throat tickle try take CHLORPHENIRAMINE  4 mg - take one every 4 hours as needed - available over the counter- may cause drowsiness so start with just a bedtime dose or two and see how you tolerate it before trying in daytime   Singulair montelukast 10 mg along with Pepcid 20 mg at bedtime every night      07/06/2016  f/u ov/Hannah Donaldson re:   Allergic rhinitis/ ? Cough variant asthma maint on singulair /gerd rx  Chief Complaint  Patient presents with  . Follow-up    2 wk f/u for cough. Pt states she is feeling better and cough has improved.   no longer at hs p takes 1st gen h1 / pepcid hs  Had a typical attack of rhinitis one day prior to OV  But did not make her cough   Not limited by breathing from desired activities    No obvious day to day or daytime variability or assoc excess/ purulent sputum or mucus plugs or hemoptysis or cp or chest tightness, subjective wheeze or overt sinus or hb symptoms. No unusual exp hx or h/o childhood pna/ asthma or knowledge of premature birth.  Sleeping ok without nocturnal  or early am exacerbation  of respiratory  c/o's or need for noct saba. Also denies any obvious fluctuation of symptoms with weather or environmental changes or other aggravating or alleviating factors except as outlined above   Current Medications, Allergies, Complete Past Medical History, Past Surgical History, Family History, and Social History were reviewed in [employer] [facility] record.  ROS  The following are not active complaints unless bolded sore throat, dysphagia, dental problems, itching, sneezing,  nasal congestion or excess/ purulent secretions, ear ache,   fever, chills, sweats, unintended wt loss, classically pleuritic or exertional cp,  orthopnea pnd or leg swelling, presyncope, palpitations, abdominal pain, anorexia, nausea, vomiting, diarrhea  or change in bowel or bladder habits, change in stools or urine, dysuria,hematuria,  rash, arthralgias, visual complaints, headache, numbness, weakness or ataxia or problems with walking or coordination,  change in mood/affect or memory.           Objective:   Physical Exam    amb pleasant female   07/06/2016        123   06/22/16 120 lb (54.4 kg)  06/18/16 120 lb 6.1 oz (54.6 kg)    Vital signs reviewed  - Note on arrival 02 sats  98% on RA      HEENT: nl dentition,   and oropharynx. Nl external ear canals without cough reflex- mild  bilateral non-specific turbinate edema     NECK :  without JVD/Nodes/TM/ nl carotid upstrokes bilaterally   LUNGS: no acc muscle use,  Nl contour chest which is clear to A and P bilaterally without cough on insp or exp maneuvers   CV:  RRR  no s3 or murmur or increase in P2, nad no edema   ABD:  soft and nontender with nl inspiratory excursion in the supine position. No bruits or organomegaly appreciated, bowel sounds nl  MS:  Nl gait/ ext warm without deformities, calf tenderness, cyanosis or clubbing No obvious joint restrictions   SKIN: warm and dry without lesions    NEURO:  alert, approp, nl sensorium with  no motor or cerebellar deficits apparent.      I personally reviewed images and agree with radiology impression as follows:  CXR:   06/18/16 Increased interstitial lung markings which may reflect interstitial pneumonia or reactive airway disease with superimposed acute bronchitis. There is no alveolar pneumonia nor CHF. My impression: no evidence of ILD         Assessment:

## 2016-07-15 ENCOUNTER — Other Ambulatory Visit: Payer: Self-pay | Admitting: Physician Assistant

## 2016-08-13 ENCOUNTER — Other Ambulatory Visit: Payer: Self-pay | Admitting: Internal Medicine

## 2016-08-13 MED ORDER — FAMOTIDINE 20 MG PO TABS: ORAL_TABLET | ORAL | 0 refills | Status: DC

## 2016-08-13 MED ORDER — MONTELUKAST SODIUM 10 MG PO TABS: 10.0000 mg | ORAL_TABLET | Freq: Every day | ORAL | 0 refills | Status: DC

## 2017-03-29 ENCOUNTER — Encounter: Payer: Self-pay | Admitting: Physician Assistant

## 2017-03-29 ENCOUNTER — Ambulatory Visit (INDEPENDENT_AMBULATORY_CARE_PROVIDER_SITE_OTHER): Payer: Commercial Managed Care - PPO | Admitting: Physician Assistant

## 2017-03-29 VITALS — BP 102/70 | HR 79 | Temp 99.2°F | Ht 60.05 in | Wt 123.0 lb

## 2017-03-29 DIAGNOSIS — H65191 Other acute nonsuppurative otitis media, right ear: Secondary | ICD-10-CM

## 2017-03-29 MED ORDER — FLUTICASONE PROPIONATE 50 MCG/ACT NA SUSP: 2.0000 | Freq: Every day | NASAL | 2 refills | Status: DC

## 2017-03-29 MED ORDER — AMOXICILLIN 875 MG PO TABS: 875.0000 mg | ORAL_TABLET | Freq: Two times a day (BID) | ORAL | 0 refills | Status: DC

## 2017-03-29 NOTE — Patient Instructions (Signed)
Start Sudafed daily x 3 days. Use 2 sprays of Flonase in both nostrils in AM and PM.  If symptoms worsen or persist after trying this, please start oral antibiotic.  Follow-up if any concerns.

## 2017-03-29 NOTE — Progress Notes (Signed)
Hannah Donaldson is a 21 y.o. female here for a new problem.  I acted as a Neurosurgeonscribe for Energy East CorporationSamantha Darryll Raju, PA-C Hannah Mullonna Orphanos, LPN  History of Present Illness:   Chief Complaint  Patient presents with  . right ear pain    Otalgia   There is pain in the right ear. This is a new (Right ear started popping last week, pain since yesterday) problem. The current episode started yesterday. The problem occurs constantly. The problem has been gradually worsening. The pain is at a severity of 3/10. The pain is mild. Associated symptoms include headaches and neck pain. Pertinent negatives include no abdominal pain, coughing, diarrhea, ear discharge, sore throat or vomiting. She has tried nothing for the symptoms.   Co-worker sick with cough, but to her knowledge doesn't have era pain. Good appetite. Has used nasal sprays in the past with good success.  Past Medical History:  Diagnosis Date  . Allergy    Seasonal  . Pyelonephritis   . UTI (urinary tract infection)      Social History   Socioeconomic History  . Marital status: Single    Spouse name: Not on file  . Number of children: Not on file  . Years of education: Not on file  . Highest education level: Not on file  Social Needs  . Financial resource strain: Not on file  . Food insecurity - worry: Not on file  . Food insecurity - inability: Not on file  . Transportation needs - medical: Not on file  . Transportation needs - non-medical: Not on file  Occupational History  . Not on file  Tobacco Use  . Smoking status: Never Smoker  . Smokeless tobacco: Never Used  Substance and Sexual Activity  . Alcohol use: Yes    Alcohol/week: 0.6 - 1.2 oz    Types: 1 - 2 Shots of liquor per week    Comment: once a month  . Drug use: No  . Sexual activity: Yes    Birth control/protection: Inserts    Comment: Nuvaring  Other Topics Concern  . 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 No pertinent surgical history.  Family History  Problem Relation Age of Onset  . Diabetes Mother   . Other Sister        pre-diabetes  . Hypertension Maternal Grandmother   . Diabetes Maternal Grandmother   . Other Maternal Grandfather        aneurysm    No Known Allergies  Current Medications:   Current Outpatient Medications:  .  loratadine (CLARITIN) 10 MG tablet, Take 10 mg daily by mouth., Disp: , Rfl:  .  NUVARING 0.12-0.015 MG/24HR vaginal ring, , Disp: , Rfl:  .  amoxicillin (AMOXIL) 875 MG tablet, Take 1 tablet (875 mg total) 2 (two) times daily by mouth., Disp: 14 tablet, Rfl: 0 .  fluticasone (FLONASE) 50 MCG/ACT nasal spray, Place 2 sprays daily into both nostrils., Disp: 16 g, Rfl: 2   Review of Systems:   Review of Systems  HENT: Positive for ear pain. Negative for ear discharge and sore throat.   Respiratory: Negative for cough.   Gastrointestinal: Negative for abdominal pain, diarrhea  and vomiting.  Musculoskeletal: Positive for neck pain.  Neurological: Positive for headaches.    Vitals:   Vitals:   03/29/17 1613  BP: 102/70  Pulse: 79  Temp: 99.2 F (37.3 C)  TempSrc: Oral  SpO2: 97%  Weight: 123 lb (55.8 kg)  Height: 5' 0.05" (1.525 m)     Body mass index is 23.98 kg/m.  Physical Exam:   Physical Exam  Constitutional: She appears well-developed. She is cooperative.  Non-toxic appearance. She does not have a sickly appearance. She does not appear ill. No distress.  HENT:  Head: Normocephalic and atraumatic.  Right Ear: External ear and ear canal normal. No drainage. Tympanic membrane is not erythematous, not retracted and not bulging. A middle ear effusion is present.  Left Ear: Tympanic membrane, external ear and ear canal normal. Tympanic  membrane is not erythematous, not retracted and not bulging.  Nose: Nose normal. Right sinus exhibits no maxillary sinus tenderness and no frontal sinus tenderness. Left sinus exhibits no maxillary sinus tenderness and no frontal sinus tenderness.  Mouth/Throat: Uvula is midline and mucous membranes are normal. No posterior oropharyngeal edema or posterior oropharyngeal erythema.  Eyes: Conjunctivae and lids are normal.  Neck: Trachea normal.  Cardiovascular: Normal rate, regular rhythm, S1 normal, S2 normal and normal heart sounds.  Pulmonary/Chest: Effort normal and breath sounds normal. She has no decreased breath sounds. She has no wheezes. She has no rhonchi. She has no rales.  Lymphadenopathy:    She has no cervical adenopathy.  Neurological: She is alert.  Skin: Skin is warm, dry and intact.  Psychiatric: She has a normal mood and affect. Her speech is normal and behavior is normal.  Nursing note and vitals reviewed.   Assessment and Plan:    Hannah Donaldson was seen today for right ear pain.  Diagnoses and all orders for this visit:  Acute effusion of right ear No red flags on exam. She does have fluid present behind R ear drum and I suspect eustachian tube dysfunction. Discussed use of Sudafed and Flonase to help with symptoms, and if no improvement or development of worsening symptoms, I recommended that she begin the oral antibiotic -- and take entire course as directed. Follow-up if needed. Patient is agreeable to plan.  Other orders -     fluticasone (FLONASE) 50 MCG/ACT nasal spray; Place 2 sprays daily into both nostrils. -     amoxicillin (AMOXIL) 875 MG tablet; Take 1 tablet (875 mg total) 2 (two) times daily by mouth.    . Reviewed expectations re: course of current medical issues. . Discussed self-management of symptoms. . Outlined signs and symptoms indicating need for more acute intervention. . Patient verbalized understanding and all questions were answered. . See  orders for this visit as documented in the electronic medical record. . Patient received an After-Visit Summary.  CMA or LPN served as scribe during this visit. History, Physical, and Plan performed by medical provider. Documentation and orders reviewed and attested to.  Jarold MottoSamantha Seydou Hearns, PA-C

## 2018-04-07 IMAGING — DX DG CHEST 2V
2 series · 2 of 2 positions shown · non-contrast
Comparison: None in PACs

CLINICAL DATA: 20-year-old with many years of chronic cough.

EXAM:
CHEST  2 VIEW

[chest pa]
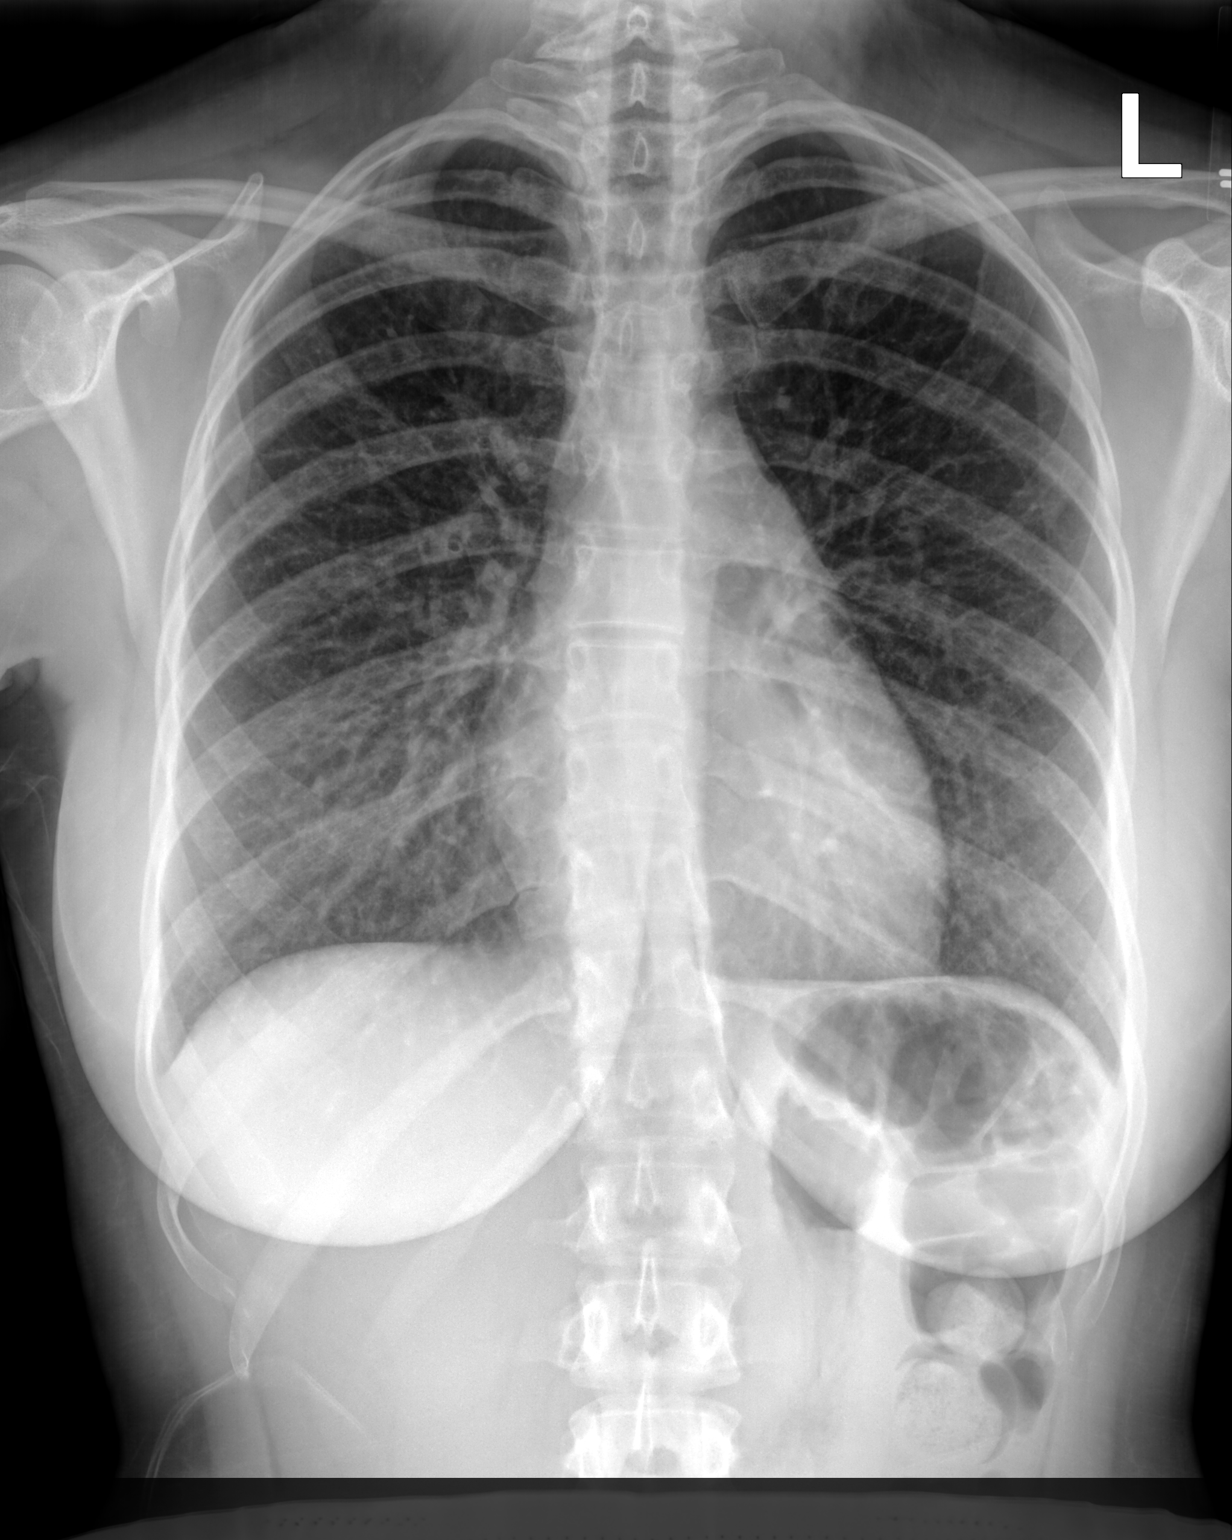

[chest lat]
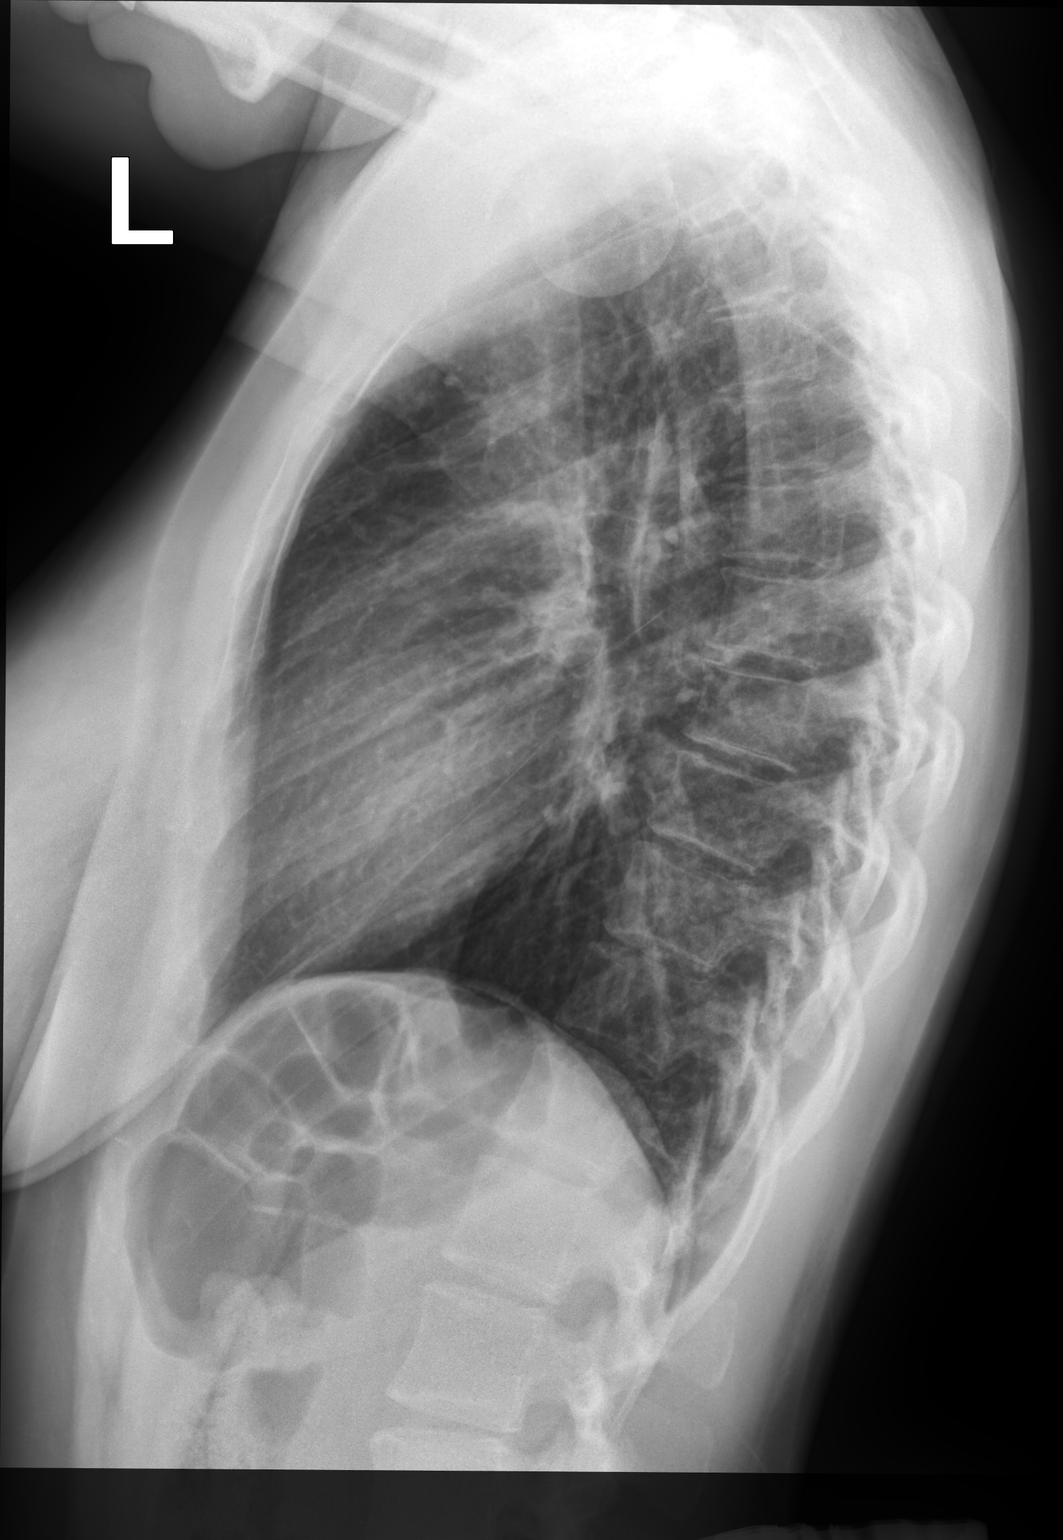

[2 of 2 positions shown; findings below may reference images not displayed]

FINDINGS: The lungs are well-expanded. The interstitial markings are coarse
bilaterally. There is no alveolar infiltrate or pleural effusion.
The heart and pulmonary vascularity are normal. The observed bony
thorax exhibits no acute abnormality. There is stent gentle
dextrocurvature centered in the mid to lower thoracic spine.
IMPRESSION: Increased interstitial lung markings which may reflect interstitial
pneumonia or reactive airway disease with superimposed acute
bronchitis. There is no alveolar pneumonia nor CHF.

## 2018-06-08 ENCOUNTER — Other Ambulatory Visit: Payer: Self-pay | Admitting: *Deleted

## 2018-06-09 ENCOUNTER — Encounter: Payer: Self-pay | Admitting: Physician Assistant

## 2018-06-09 ENCOUNTER — Ambulatory Visit (INDEPENDENT_AMBULATORY_CARE_PROVIDER_SITE_OTHER): Payer: Commercial Managed Care - PPO | Admitting: Physician Assistant

## 2018-06-09 VITALS — BP 100/62 | HR 75 | Temp 98.7°F | Ht 60.0 in | Wt 123.5 lb

## 2018-06-09 DIAGNOSIS — R438 Other disturbances of smell and taste: Secondary | ICD-10-CM | POA: Diagnosis not present

## 2018-06-09 DIAGNOSIS — F419 Anxiety disorder, unspecified: Secondary | ICD-10-CM | POA: Diagnosis not present

## 2018-06-09 DIAGNOSIS — Z0001 Encounter for general adult medical examination with abnormal findings: Secondary | ICD-10-CM

## 2018-06-09 DIAGNOSIS — R431 Parosmia: Secondary | ICD-10-CM

## 2018-06-09 DIAGNOSIS — Z13220 Encounter for screening for lipoid disorders: Secondary | ICD-10-CM

## 2018-06-09 DIAGNOSIS — Z114 Encounter for screening for human immunodeficiency virus [HIV]: Secondary | ICD-10-CM | POA: Diagnosis not present

## 2018-06-09 DIAGNOSIS — Z136 Encounter for screening for cardiovascular disorders: Secondary | ICD-10-CM

## 2018-06-09 LAB — CBC WITH DIFFERENTIAL/PLATELET
BASOS PCT: 0.5 % (ref 0.0–3.0)
Basophils Absolute: 0 10*3/uL (ref 0.0–0.1)
EOS ABS: 0.2 10*3/uL (ref 0.0–0.7)
Eosinophils Relative: 3.5 % (ref 0.0–5.0)
HCT: 38.6 % (ref 36.0–46.0)
Hemoglobin: 12.7 g/dL (ref 12.0–15.0)
Lymphocytes Relative: 49.3 % — ABNORMAL HIGH (ref 12.0–46.0)
Lymphs Abs: 2.9 10*3/uL (ref 0.7–4.0)
MCHC: 32.9 g/dL (ref 30.0–36.0)
MCV: 89 fl (ref 78.0–100.0)
MONO ABS: 0.3 10*3/uL (ref 0.1–1.0)
Monocytes Relative: 5.9 % (ref 3.0–12.0)
Neutro Abs: 2.4 10*3/uL (ref 1.4–7.7)
Neutrophils Relative %: 40.8 % — ABNORMAL LOW (ref 43.0–77.0)
Platelets: 242 10*3/uL (ref 150.0–400.0)
RBC: 4.34 Mil/uL (ref 3.87–5.11)
RDW: 14.4 % (ref 11.5–15.5)
WBC: 5.9 10*3/uL (ref 4.0–10.5)

## 2018-06-09 LAB — LIPID PANEL
CHOLESTEROL: 183 mg/dL (ref 0–200)
HDL: 54.7 mg/dL (ref >39.00)
LDL Cholesterol: 110 mg/dL — ABNORMAL HIGH (ref 0–99)
NonHDL: 128.63
Total CHOL/HDL Ratio: 3
Triglycerides: 95 mg/dL (ref 0.0–149.0)
VLDL: 19 mg/dL (ref 0.0–40.0)

## 2018-06-09 LAB — COMPREHENSIVE METABOLIC PANEL
ALT: 11 U/L (ref 0–35)
AST: 15 U/L (ref 0–37)
Albumin: 3.7 g/dL (ref 3.5–5.2)
Alkaline Phosphatase: 36 U/L — ABNORMAL LOW (ref 39–117)
BUN: 10 mg/dL (ref 6–23)
CO2: 25 mEq/L (ref 19–32)
CREATININE: 0.53 mg/dL (ref 0.40–1.20)
Calcium: 9.1 mg/dL (ref 8.4–10.5)
Chloride: 105 mEq/L (ref 96–112)
GFR: 142.14 mL/min (ref >60.00)
GLUCOSE: 98 mg/dL (ref 70–99)
Potassium: 4 mEq/L (ref 3.5–5.1)
SODIUM: 136 meq/L (ref 135–145)
Total Bilirubin: 0.7 mg/dL (ref 0.2–1.2)
Total Protein: 7 g/dL (ref 6.0–8.3)

## 2018-06-09 LAB — TSH: TSH: 0.68 u[IU]/mL (ref 0.35–4.50)

## 2018-06-09 LAB — HCG, QUANTITATIVE, PREGNANCY

## 2018-06-09 MED ORDER — BUSPIRONE HCL 5 MG PO TABS: 5.0000 mg | ORAL_TABLET | Freq: Three times a day (TID) | ORAL | 1 refills | Status: AC | PRN

## 2018-06-09 NOTE — Patient Instructions (Signed)
It was great to see you!  Please go to the lab for blood work.   Our office will call you with your results unless you have chosen to receive results via MyChart.  If your blood work is normal we will follow-up each year for physicals and as scheduled for chronic medical problems.  If anything is abnormal we will treat accordingly and get you in for a follow-up.  For your anxiety: may take Bupsar as needed, up to three times daily, for your anxiety.   Take care,  [facility] Maintenance, Female Adopting a healthy lifestyle and getting preventive care can go a long way to promote health and wellness. Talk with your health care provider about what schedule of regular examinations is right for you. This is a good chance for you to check in with your provider about disease prevention and staying healthy. In between checkups, there are plenty of things you can do on your own. Experts have done a lot of research about which lifestyle changes and preventive measures are most likely to keep you healthy. Ask your health care provider for more information. Weight and diet Eat a healthy diet  Be sure to include plenty of vegetables, fruits, low-fat dairy products, and lean protein.  Do not eat a lot of foods high in solid fats, added sugars, or salt.  Get regular exercise. This is one of the most important things you can do for your health. ? Most adults should exercise for at least 150 minutes each week. The exercise should increase your heart rate and make you sweat (moderate-intensity exercise). ? Most adults should also do strengthening exercises at least twice a week. This is in addition to the moderate-intensity exercise. Maintain a healthy weight  Body mass index (BMI) is a measurement that can be used to identify possible weight problems. It estimates body fat based on height and weight. Your health care provider can help determine your BMI and help you achieve or maintain a  healthy weight.  For females 65 years of age and older: ? A BMI below 18.5 is considered underweight. ? A BMI of 18.5 to 24.9 is normal. ? A BMI of 25 to 29.9 is considered overweight. ? A BMI of 30 and above is considered obese. Watch levels of cholesterol and blood lipids  You should start having your blood tested for lipids and cholesterol at 24 years of age, then have this test every 5 years.  You may need to have your cholesterol levels checked more often if: ? Your lipid or cholesterol levels are high. ? You are older than 24 years of age. ? You are at high risk for heart disease. Cancer screening Lung Cancer  Lung cancer screening is recommended for adults 21-22 years old who are at high risk for lung cancer because of a history of smoking.  A yearly low-dose CT scan of the lungs is recommended for people who: ? Currently smoke. ? Have quit within the past 15 years. ? Have at least a 30-pack-year history of smoking. A pack year is smoking an average of one pack of cigarettes a day for 1 year.  Yearly screening should continue until it has been 15 years since you quit.  Yearly screening should stop if you develop a health problem that would prevent you from having lung cancer treatment. Breast Cancer  Practice breast self-awareness. This means understanding how your breasts normally appear and feel.  It also means doing regular breast self-exams.  Let your health care provider know about any changes, no matter how small.  If you are in your 20s or 30s, you should have a clinical breast exam (CBE) by a health care provider every 1-3 years as part of a regular health exam.  If you are 86 or older, have a CBE every year. Also consider having a breast X-ray (mammogram) every year.  If you have a family history of breast cancer, talk to your health care provider about genetic screening.  If you are at high risk for breast cancer, talk to your health care provider about having  an MRI and a mammogram every year.  Breast cancer gene (BRCA) assessment is recommended for women who have family members with BRCA-related cancers. BRCA-related cancers include: ? Breast. ? Ovarian. ? Tubal. ? Peritoneal cancers.  Results of the assessment will determine the need for genetic counseling and BRCA1 and BRCA2 testing. Cervical Cancer Your health care provider may recommend that you be screened regularly for cancer of the pelvic organs (ovaries, uterus, and vagina). This screening involves a pelvic examination, including checking for microscopic changes to the surface of your cervix (Pap test). You may be encouraged to have this screening done every 3 years, beginning at age 77.  For women ages 71-65, health care providers may recommend pelvic exams and Pap testing every 3 years, or they may recommend the Pap and pelvic exam, combined with testing for human papilloma virus (HPV), every 5 years. Some types of HPV increase your risk of cervical cancer. Testing for HPV may also be done on women of any age with unclear Pap test results.  Other health care providers may not recommend any screening for nonpregnant women who are considered low risk for pelvic cancer and who do not have symptoms. Ask your health care provider if a screening pelvic exam is right for you.  If you have had past treatment for cervical cancer or a condition that could lead to cancer, you need Pap tests and screening for cancer for at least 20 years after your treatment. If Pap tests have been discontinued, your risk factors (such as having a new sexual partner) need to be reassessed to determine if screening should resume. Some women have medical problems that increase the chance of getting cervical cancer. In these cases, your health care provider may recommend more frequent screening and Pap tests. Colorectal Cancer  This type of cancer can be detected and often prevented.  Routine colorectal cancer screening  usually begins at 24 years of age and continues through 24 years of age.  Your health care provider may recommend screening at an earlier age if you have risk factors for colon cancer.  Your health care provider may also recommend using home test kits to check for hidden blood in the stool.  A small camera at the end of a tube can be used to examine your colon directly (sigmoidoscopy or colonoscopy). This is done to check for the earliest forms of colorectal cancer.  Routine screening usually begins at age 36.  Direct examination of the colon should be repeated every 5-10 years through 24 years of age. However, you may need to be screened more often if early forms of precancerous polyps or small growths are found. Skin Cancer  Check your skin from head to toe regularly.  Tell your health care provider about any new moles or changes in moles, especially if there is a change in a mole's shape or color.  Also tell  your health care provider if you have a mole that is larger than the size of a pencil eraser.  Always use sunscreen. Apply sunscreen liberally and repeatedly throughout the day.  Protect yourself by wearing long sleeves, pants, a wide-brimmed hat, and sunglasses whenever you are outside. Heart disease, diabetes, and high blood pressure  High blood pressure causes heart disease and increases the risk of stroke. High blood pressure is more likely to develop in: ? People who have blood pressure in the high end of the normal range (130-139/85-89 mm Hg). ? People who are overweight or obese. ? People who are African American.  If you are 42-43 years of age, have your blood pressure checked every 3-5 years. If you are 56 years of age or older, have your blood pressure checked every year. You should have your blood pressure measured twice-once when you are at a hospital or clinic, and once when you are not at a hospital or clinic. Record the average of the two measurements. To check your  blood pressure when you are not at a hospital or clinic, you can use: ? An automated blood pressure machine at a pharmacy. ? A home blood pressure monitor.  If you are between 11 years and 80 years old, ask your health care provider if you should take aspirin to prevent strokes.  Have regular diabetes screenings. This involves taking a blood sample to check your fasting blood sugar level. ? If you are at a normal weight and have a low risk for diabetes, have this test once every three years after 24 years of age. ? If you are overweight and have a high risk for diabetes, consider being tested at a younger age or more often. Preventing infection Hepatitis B  If you have a higher risk for hepatitis B, you should be screened for this virus. You are considered at high risk for hepatitis B if: ? You were born in a country where hepatitis B is common. Ask your health care provider which countries are considered high risk. ? Your parents were born in a high-risk country, and you have not been immunized against hepatitis B (hepatitis B vaccine). ? You have HIV or AIDS. ? You use needles to inject street drugs. ? You live with someone who has hepatitis B. ? You have had sex with someone who has hepatitis B. ? You get hemodialysis treatment. ? You take certain medicines for conditions, including cancer, organ transplantation, and autoimmune conditions. Hepatitis C  Blood testing is recommended for: ? Everyone born from [birth year] through 1966. ? Anyone with known risk factors for hepatitis C. Sexually transmitted infections (STIs)  You should be screened for sexually transmitted infections (STIs) including gonorrhea and chlamydia if: ? You are sexually active and are younger than 24 years of age. ? You are older than 24 years of age and your health care provider tells you that you are at risk for this type of infection. ? Your sexual activity has changed since you were last screened and you are at an  increased risk for chlamydia or gonorrhea. Ask your health care provider if you are at risk.  If you do not have HIV, but are at risk, it may be recommended that you take a prescription medicine daily to prevent HIV infection. This is called pre-exposure prophylaxis (PrEP). You are considered at risk if: ? You are sexually active and do not regularly use condoms or know the HIV status of your partner(s). ? You take  drugs by injection. ? You are sexually active with a partner who has HIV. Talk with your health care provider about whether you are at high risk of being infected with HIV. If you choose to begin PrEP, you should first be tested for HIV. You should then be tested every 3 months for as long as you are taking PrEP. Pregnancy  If you are premenopausal and you may become pregnant, ask your health care provider about preconception counseling.  If you may become pregnant, take 400 to 800 micrograms (mcg) of folic acid every day.  If you want to prevent pregnancy, talk to your health care provider about birth control (contraception). Osteoporosis and menopause  Osteoporosis is a disease in which the bones lose minerals and strength with aging. This can result in serious bone fractures. Your risk for osteoporosis can be identified using a bone density scan.  If you are 6 years of age or older, or if you are at risk for osteoporosis and fractures, ask your health care provider if you should be screened.  Ask your health care provider whether you should take a calcium or vitamin D supplement to lower your risk for osteoporosis.  Menopause may have certain physical symptoms and risks.  Hormone replacement therapy may reduce some of these symptoms and risks. Talk to your health care provider about whether hormone replacement therapy is right for you. Follow these instructions at home:  Schedule regular health, dental, and eye exams.  Stay current with your immunizations.  Do not use  any tobacco products including cigarettes, chewing tobacco, or electronic cigarettes.  If you are pregnant, do not drink alcohol.  If you are breastfeeding, limit how much and how often you drink alcohol.  Limit alcohol intake to no more than 1 drink per day for nonpregnant women. One drink equals 12 ounces of beer, 5 ounces of wine, or 1 ounces of hard liquor.  Do not use street drugs.  Do not share needles.  Ask your health care provider for help if you need support or information about quitting drugs.  Tell your health care provider if you often feel depressed.  Tell your health care provider if you have ever been abused or do not feel safe at home. This information is not intended to replace advice given to you by your health care provider. Make sure you discuss any questions you have with your health care provider. Document Released: 11/17/2010 Document Revised: 10/10/2015 Document Reviewed: 02/05/2015 Elsevier Interactive Patient Education  2019 [employer].

## 2018-06-09 NOTE — Progress Notes (Signed)
I acted as a Neurosurgeon for [employer], PA-C Corky Mull, LPN  Subjective:    Hannah Donaldson is a 23 y.o. female and is here for a comprehensive physical exam.  HPI  Health Maintenance Due  Topic Date Due  . HIV Screening  10/12/2009    Acute Concerns: None  Chronic Issues: Cough -- takes allergy pill as needed, not using inhaler Increased smell sensitivity -- over the past few months has noticed that she has increased smell sensitivity, works at an [employer] and has issues with smells that she normally doesn't have issues with. Denies pregnancy. Anxiety -- over thinking, worrying about things that are not usually of concern (ex -- worrying about her boyfriend getting really sick)  Health Maintenance: Immunizations -- UTD, Declined Flu Colonoscopy -- N/A Mammogram -- N/A PAP -- UTD,  Done 06/03/2018 will obtain records from GYN Bone Density -- N/A Diet -- well-balanced Caffeine intake -- coffee daily, no excessive intake Sleep habits -- sleeps well Exercise -- as able Current Weight -- Weight: 123 lb 8 oz (56 kg)  Weight History: Wt Readings from Last 10 Encounters:  06/09/18 123 lb 8 oz (56 kg)  03/29/17 123 lb (55.8 kg)  07/06/16 123 lb 9.6 oz (56.1 kg)  06/22/16 120 lb (54.4 kg)  06/18/16 120 lb 6.1 oz (54.6 kg)   Mood -- beginning to have anxiety, denies depression Patient's last menstrual period was 05/14/2018. Period characteristics -- normal Birth control -- Nuva Ring  Depression screen [facility] 2/9 06/09/2018  Decreased Interest 0  Down, Depressed, Hopeless 0  PHQ - 2 Score 0   GAD 7 : Generalized Anxiety Score 06/09/2018  Nervous, Anxious, on Edge 1  Control/stop worrying 1  Worry too much - different things 1  Trouble relaxing 1  Restless 2  Easily annoyed or irritable 0  Afraid - awful might happen 2  Total GAD 7 Score 8  Anxiety Difficulty Not difficult at all    Other providers/specialists: Patient Care Team: Jarold Motto, [place] as  PCP - General (Physician Assistant) Tracey Harries, MD as Consulting Physician (Obstetrics and Gynecology)   PMHx, SurgHx, SocialHx, Medications, and Allergies were reviewed in the Visit Navigator and updated as appropriate.   Past Medical History:  Diagnosis Date  . Allergy    Seasonal  . Pyelonephritis   . UTI (urinary tract infection)     History reviewed. No pertinent surgical history.   Family History  Problem Relation Age of Onset  . Diabetes Mother   . Other Sister        pre-diabetes  . Hypertension Maternal Grandmother   . Diabetes Maternal Grandmother   . Other Maternal Grandfather        aneurysm  . Breast cancer Neg Hx   . Colon cancer Neg Hx     Social History   Tobacco Use  . Smoking status: Never Smoker  . Smokeless tobacco: Never Used  Substance Use Topics  . Alcohol use: Yes    Alcohol/week: 1.0 - 2.0 standard drinks    Types: 1 - 2 Shots of liquor per week    Comment: once a month  . Drug use: No    Review of Systems:   Review of Systems  Constitutional: Negative.  Negative for chills, fever, malaise/fatigue and weight loss.  HENT: Negative.  Negative for hearing loss, sinus pain and sore throat.   Eyes: Negative.  Negative for blurred vision.  Respiratory: Negative.  Negative for cough and shortness of  breath.   Cardiovascular: Negative.  Negative for chest pain, palpitations and leg swelling.  Gastrointestinal: Positive for heartburn. Negative for abdominal pain, constipation, diarrhea, nausea and vomiting.  Genitourinary: Negative.  Negative for dysuria, frequency and urgency.  Musculoskeletal: Negative.  Negative for back pain, myalgias and neck pain.  Skin: Negative.  Negative for itching and rash.  Neurological: Negative.  Negative for dizziness, tingling, seizures, loss of consciousness and headaches.  Endo/Heme/Allergies: Negative.  Negative for polydipsia.  Psychiatric/Behavioral: Negative.  Negative for depression. The patient is not  nervous/anxious.     Objective:   BP 100/62 (BP Location: Left Arm, Patient Position: Sitting, Cuff Size: Normal)   Pulse 75   Temp 98.7 F (37.1 C) (Oral)   Ht 5' (1.524 m)   Wt 123 lb 8 oz (56 kg)   LMP 05/14/2018   SpO2 98%   BMI 24.12 kg/m   General Appearance:    Alert, cooperative, no distress, appears stated age  Head:    Normocephalic, without obvious abnormality, atraumatic  Eyes:    PERRL, conjunctiva/corneas clear, EOM's intact, fundi    benign, both eyes  Ears:    Normal TM's and external ear canals, both ears  Nose:   Nares normal, septum midline, mucosa normal, no drainage    or sinus tenderness  Throat:   Lips, mucosa, and tongue normal; teeth and gums normal  Neck:   Supple, symmetrical, trachea midline, no adenopathy;    thyroid:  no enlargement/tenderness/nodules; no carotid   bruit or JVD  Back:     Symmetric, no curvature, ROM normal, no CVA tenderness  Lungs:     Clear to auscultation bilaterally, respirations unlabored  Chest Wall:    No tenderness or deformity   Heart:    Regular rate and rhythm, S1 and S2 normal, no murmur, rub   or gallop  Breast Exam:    No tenderness, masses, or nipple abnormality  Abdomen:     Soft, non-tender, bowel sounds active all four quadrants,    no masses, no organomegaly  Genitalia:    Deferred  Rectal:    Deferred  Extremities:   Extremities normal, atraumatic, no cyanosis or edema  Pulses:   2+ and symmetric all extremities  Skin:   Skin color, texture, turgor normal, no rashes or lesions  Lymph nodes:   Cervical, supraclavicular, and axillary nodes normal  Neurologic:   CNII-XII intact, normal strength, sensation and reflexes    throughout    Assessment/Plan:   Hannah Donaldson was seen today for annual exam.  Diagnoses and all orders for this visit:  Encounter for general adult medical examination with abnormal findings Today patient counseled on age appropriate routine health concerns for screening and prevention,  each reviewed and up to date or declined. Immunizations reviewed and up to date or declined. Labs ordered and reviewed. Risk factors for depression reviewed and negative. Hearing function and visual acuity are intact. ADLs screened and addressed as needed. Functional ability and level of safety reviewed and appropriate. Education, counseling and referrals performed based on assessed risks today. Patient provided with a copy of personalized plan for preventive services.  Anxiety Currently uncontrolled. Will check labs to r/o organic cause. Buspar TID prn. Counseling recommended. Follow-up in 1 month, sooner if needed. -     CBC with Differential/Platelet -     Comprehensive metabolic panel -     TSH  Screening for HIV (human immunodeficiency virus) -     HIV Antibody (routine testing w rflx)  Encounter for lipid screening for cardiovascular disease -     Lipid panel  Increased sense of smell Unclear etiology. Will check pregnancy test. She thinks it may be related to anxiety but she is unsure. -     B-HCG Quant  Other orders -     busPIRone (BUSPAR) 5 MG tablet; Take 1 tablet (5 mg total) by mouth 3 (three) times daily as needed.   Well Adult Exam: Labs ordered: Yes. Patient counseling was done. See below for items discussed. Discussed the patient's BMI. The BMI is in the acceptable range Follow up in 1 month.  Patient Counseling:   [x]     Nutrition: Stressed importance of moderation in sodium/caffeine intake, saturated fat and cholesterol, caloric balance, sufficient intake of fresh fruits, vegetables, fiber, calcium, iron, and 1 mg of folate supplement per day (for females capable of pregnancy).   [x]      Stressed the importance of regular exercise.    [x]     Substance Abuse: Discussed cessation/primary prevention of tobacco, alcohol, or other drug use; driving or other dangerous activities under the influence; availability of treatment for abuse.    [x]      Injury prevention:  Discussed safety belts, safety helmets, smoke detector, smoking near bedding or upholstery.    [x]      Sexuality: Discussed sexually transmitted diseases, partner selection, use of condoms, avoidance of unintended pregnancy  and contraceptive alternatives.    [x]     Dental health: Discussed importance of regular tooth brushing, flossing, and dental visits.   [x]      Health maintenance and immunizations reviewed. Please refer to Health maintenance section.   CMA or LPN served as scribe during this visit. History, Physical, and Plan performed by medical provider. The above documentation has been reviewed and is accurate and complete.   Jarold MottoSamantha Tashi Band, PA-C [facility]

## 2018-06-10 LAB — HM PAP SMEAR

## 2018-06-10 LAB — HIV ANTIBODY (ROUTINE TESTING W REFLEX): HIV: NONREACTIVE

## 2018-06-22 ENCOUNTER — Encounter: Payer: Self-pay | Admitting: Physician Assistant

## 2018-06-22 LAB — CHLAMYDIA/GONOCOCCUS/TRICHOMONAS, NAA
CHLAMYDIA BY NAA: NEGATIVE
NEISSERIA GONORRHOEAE, NAA: NEGATIVE
TRICHOMONAS: NEGATIVE

## 2018-08-17 ENCOUNTER — Ambulatory Visit (INDEPENDENT_AMBULATORY_CARE_PROVIDER_SITE_OTHER): Payer: Commercial Managed Care - PPO | Admitting: Physician Assistant

## 2018-08-17 ENCOUNTER — Ambulatory Visit: Payer: Self-pay | Admitting: Physician Assistant

## 2018-08-17 DIAGNOSIS — R109 Unspecified abdominal pain: Secondary | ICD-10-CM

## 2018-08-17 NOTE — Telephone Encounter (Signed)
She called in co feeling lightheaded and dizzy before she eats and has hunger pains.  She saw Hannah Donaldson for these same symptoms see Jan. And had blood work done and everything was fine.   These symptoms have been going on for a year.  Due to the coronavirus Hannah Donaldson is doing video chat/phone call visits with pts.  I verified her phone number and e mail Hannah Donaldson  They are correct.  I let the pt know someone would be calling her to set up this appt.    She was agreeable.  Reason for Disposition . [1] MILD dizziness (e.g., walking normally) AND [2] has been evaluated by physician for this  Answer Assessment - Initial Assessment Questions 1. DESCRIPTION: "Describe your dizziness."     Before I eat I severe hungar pains and I feel lightheaded and dizzy.   My stomach hurts when I'm [place].   I had blood work done back in Jan.  They were normal.pens 2. LIGHTHEADED: "Do you feel lightheaded?" (e.g., somewhat faint, woozy, weak upon standing)     I just ate at 12:00 and I feel really  Awful right now.    Lightheaded and my stomach pain.   This happens every day.3. VERTIGO: "Do you feel like either you or the room is spinning or tilting?" (i.e. vertigo)     Lightheaded pass out feeling. 4. SEVERITY: "How bad is it?"  "Do you feel like you are going to faint?" "Can you stand and walk?"   - MILD - walking normally   - MODERATE - interferes with normal activities (e.g., work, school)    - SEVERE - unable to stand, requires support to walk, feels like passing out now.      It happens every day.   I've never passed out. 5. ONSET:  "When did the dizziness begin?"     Last year.    My mother is diabetic.   I thought maybe I was diabetic.   Thinking this was all from diabetes.    My blood work did not show any diabetes.    6. AGGRAVATING FACTORS: "Does anything make it worse?" (e.g., standing, change in head position)     I eat a lot to surpress the hungar feeling.   I eat multiple things.  I went to the store  after I ate to get more to eat because I was too [place].    7. HEART RATE: "Can you tell me your heart rate?" "How many beats in 15 seconds?"  (Note: not all patients can do this)       NOt asked 8. CAUSE: "What do you think is causing the dizziness?"     I don't know.    Maybe I don't drink enough fluids. 9. RECURRENT SYMPTOM: "Have you had dizziness before?" If so, ask: "When was the last time?" "What happened that time?"     No 10. OTHER SYMPTOMS: "Do you have any other symptoms?" (e.g., fever, chest pain, vomiting, diarrhea, bleeding)       I do vomit sometimes from coughing from my allergies. 11. PREGNANCY: "Is there any chance you are pregnant?" "When was your last menstrual period?"       No.  18th was last period  Protocols used: DIZZINESS [facility]

## 2018-08-17 NOTE — Progress Notes (Signed)
Patient did not show for today's appointment. Rescheduled for tomorrow.

## 2018-08-17 NOTE — Progress Notes (Signed)
Virtual Visit via Video   I connected with Hannah Donaldson on 08/18/18 at  9:00 AM EDT by a video enabled telemedicine application and verified that I am speaking with the correct person using two identifiers. Location patient: Home Location provider: [employer], Office Persons participating in the virtual visit: Hannah Donaldson, Hannah Donaldson, Hannah Donaldson, Hannah Mull, Hannah Donaldson  I discussed the limitations of evaluation and management by telemedicine and the availability of in person appointments. The patient expressed understanding and agreed to proceed.  Subjective:   HPI:   Abdominal pain & Lightheaded Pt c/o feeling lightheaded and dizzy also having hunger pains everyday prior to eating, says this has been going on x 1 year. Pt says she has never passed out. Mother is diabetic and she has concerns that she has DM. She does think that she doesn't drink enough fluids. Vomiting sometimes when coughing due to allergies. Pt states this is happening at breakfast, lunch and dinner. Eats breakfast at 8:00, Lunch 12 to 1:00, dinner at 6:00 pm. Breakfast eats cereal or chicken biscuit or bagel, drinks coffee. Lunch: Spaghetti, pizza, chicken or soup, drinks juice. Dinner: Fish, salmon, chicken and drinks juice. Snack: ice cream at bedtime, during the day eating granola bars.   Denies: syncope, fever, palpitations, chest pain, changes in vision  Labs performed at last visit including TSH, CBC and CMP were WNL in Jan.  ROS: See pertinent positives and negatives per HPI.  Patient Active Problem List   Diagnosis Date Noted  . 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 Smoking status: Never Smoker  . Smokeless tobacco: Never Used  Substance Use Topics  . Alcohol use: Yes    Alcohol/week: 1.0 - 2.0 standard drinks    Types: 1 - 2 Shots of liquor per week    Comment: once a month    Current Outpatient Medications:  .  busPIRone (BUSPAR) 5 MG tablet, Take 1  tablet (5 mg total) by mouth 3 (three) times daily as needed., Disp: 60 tablet, Rfl: 1 .  etonogestrel-ethinyl estradiol (NUVARING) 0.12-0.015 MG/24HR vaginal ring, NuvaRing 0.12 mg -0.015 mg/24 hr vaginal  INSERT 1 VAGINAL RING(S) EVERY MONTH BY VAGINAL ROUTE., Disp: , Rfl:   No Known Allergies  Objective:   VITALS: Per patient if applicable, see vitals. GENERAL: Alert, appears well and in no acute distress. HEENT: Atraumatic, conjunctiva clear, no obvious abnormalities on inspection of external nose and ears. NECK: Normal movements of the head and neck. CARDIOPULMONARY: No increased WOB. Speaking in clear sentences. I:E ratio WNL.  MS: Moves all visible extremities without noticeable abnormality. PSYCH: Pleasant and cooperative, well-groomed. Speech normal rate and rhythm. Affect is appropriate. Insight and judgement are appropriate. Attention is focused, linear, and appropriate.  NEURO: CN grossly intact. Oriented as arrived to appointment on time with no prompting. Moves both UE equally.  SKIN: No obvious lesions, wounds, erythema, or cyanosis noted on face or hands.  Assessment and Plan:     [name] was seen today for dizziness.  Diagnoses and all orders for this visit:  Episodic lightheadedness  Patient is potentially having reactive hypoglycemia. She eats a CHO heavy diet, including beverages. Recommended increasing protein and water intake throughout the day. Limit simple carbs. Advised patient to check blood sugars in AM, when symptomatic and randomly throughout the day to get more information.  Pt will stop by office to pick up glucometer so she can check sugars and for further  instructions.  Pt to follow up in two weeks to discuss symptoms, sooner if needed.  . Reviewed expectations re: course of current medical issues. . Discussed self-management of symptoms. . Outlined signs and symptoms indicating need for more acute intervention. . Patient verbalized understanding  and all questions were answered. Marland Kitchen Health Maintenance issues including appropriate healthy diet, exercise, and smoking avoidance were discussed with patient. . See orders for this visit as documented in the electronic medical record. I discussed the assessment and treatment plan with the patient. The patient was provided an opportunity to ask questions and all were answered. The patient agreed with the plan and demonstrated an understanding of the instructions.   The patient was advised to call back or seek an in-person evaluation if the symptoms worsen or if the condition fails to improve as anticipated.     [place]lett, Hannah Donaldson 08/18/2018

## 2018-08-17 NOTE — Telephone Encounter (Signed)
Please schedule web ex.

## 2018-08-18 ENCOUNTER — Encounter: Payer: Self-pay | Admitting: Physician Assistant

## 2018-08-18 ENCOUNTER — Ambulatory Visit (INDEPENDENT_AMBULATORY_CARE_PROVIDER_SITE_OTHER): Payer: Commercial Managed Care - PPO | Admitting: Physician Assistant

## 2018-08-18 DIAGNOSIS — R42 Dizziness and giddiness: Secondary | ICD-10-CM | POA: Diagnosis not present

## 2018-08-18 NOTE — Patient Instructions (Signed)
It was great to see you!  Blood sugar goals Fasting: 80-130 2 hours after meal: Less than 180  Please check your blood sugar twice a day.  Vary the time of day when you check, between before the 3 meals, and at bedtime.    Also check if you have symptoms of your blood sugar being too high or too low.  Please keep a record of the readings and bring it to your next appointment here.    Please call us sooner if your blood sugar goes below 70, or if you have a lot of readings over 200.    Please start drinking more water and limiting sweets.  Follow-up in 2 weeks via Webex.

## 2018-08-19 ENCOUNTER — Telehealth: Payer: Self-pay | Admitting: Physician Assistant

## 2018-08-19 NOTE — Telephone Encounter (Signed)
Copied from CRM [id number]. Topic: General - Inquiry >> Aug 19, 2018  4:23 PM Terisa Starr wrote: Reason for CRM: pt said she will be by Wednesday to get the blood sugar meter from [place], [place]. She said it should be at the front desk

## 2018-08-22 NOTE — Telephone Encounter (Signed)
Noted, is at the front desk.

## 2018-09-07 ENCOUNTER — Other Ambulatory Visit: Payer: Self-pay

## 2018-09-07 ENCOUNTER — Ambulatory Visit (INDEPENDENT_AMBULATORY_CARE_PROVIDER_SITE_OTHER): Payer: Commercial Managed Care - PPO | Admitting: Physician Assistant

## 2018-09-07 ENCOUNTER — Encounter: Payer: Self-pay | Admitting: Physician Assistant

## 2018-09-07 DIAGNOSIS — L237 Allergic contact dermatitis due to plants, except food: Secondary | ICD-10-CM | POA: Diagnosis not present

## 2018-09-07 MED ORDER — TRIAMCINOLONE ACETONIDE 0.025 % EX CREA: TOPICAL_CREAM | CUTANEOUS | 0 refills | Status: DC

## 2018-09-07 MED ORDER — PREDNISONE 20 MG PO TABS: 20.0000 mg | ORAL_TABLET | Freq: Every day | ORAL | 0 refills | Status: DC

## 2018-09-07 MED ORDER — PREDNISONE 20 MG PO TABS: 20.0000 mg | ORAL_TABLET | Freq: Every day | ORAL | 0 refills | Status: AC

## 2018-09-07 NOTE — Progress Notes (Signed)
Virtual Visit via Video   I connected with Hannah Donaldson on 09/07/18 at  4:00 PM EDT by a video enabled telemedicine application and verified that I am speaking with the correct person using two identifiers. Location patient: Home Location provider: [facility], Office Persons participating in the virtual visit: Zohra Pennella, Jarold Motto, [place]   I discussed the limitations of evaluation and management by telemedicine and the availability of in person appointments. The patient expressed understanding and agreed to proceed.  Subjective:   HPI: Patient reports that she has been gardening more recently. She noticed that she had some itching to her hands and face afterwards. She has found poison ivy in her garden. She has tried an OTC histamine with no relief. The areas on her R hand are blistering. Denies concerns for infection -- denies fevers, chills, purulent drainage.  ROS: See pertinent positives and negatives per HPI.  Patient Active Problem List   Diagnosis Date Noted  . 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 Smoking status: Never Smoker  . Smokeless tobacco: Never Used  Substance Use Topics  . Alcohol use: Yes    Alcohol/week: 1.0 - 2.0 standard drinks    Types: 1 - 2 Shots of liquor per week    Comment: once a month    Current Outpatient Medications:  .  busPIRone (BUSPAR) 5 MG tablet, Take 1 tablet (5 mg total) by mouth 3 (three) times daily as needed., Disp: 60 tablet, Rfl: 1 .  etonogestrel-ethinyl estradiol (NUVARING) 0.12-0.015 MG/24HR vaginal ring, NuvaRing 0.12 mg -0.015 mg/24 hr vaginal  INSERT 1 VAGINAL RING(S) EVERY MONTH BY VAGINAL ROUTE., Disp: , Rfl:  .  predniSONE (DELTASONE) 20 MG tablet, Take 1 tablet (20 mg total) by mouth daily with breakfast for 7 days., Disp: 7 tablet, Rfl: 0 .  triamcinolone (KENALOG) 0.025 % cream, Apply to affected area 1-2 times daily, Disp: 16 g, Rfl: 0  No Known Allergies   Objective:   VITALS: Per patient if applicable, see vitals. GENERAL: Alert, appears well and in no acute distress. HEENT: Atraumatic, conjunctiva clear, no obvious abnormalities on inspection of external nose and ears. NECK: Normal movements of the head and neck. CARDIOPULMONARY: No increased WOB. Speaking in clear sentences. I:E ratio WNL.  MS: Moves all visible extremities without noticeable abnormality. PSYCH: Pleasant and cooperative, well-groomed. Speech normal rate and rhythm. Affect is appropriate. Insight and judgement are appropriate. Attention is focused, linear, and appropriate.  NEURO: CN grossly intact. Oriented as arrived to appointment on time with no prompting. Moves both UE equally.  SKIN: Erythematous, bumpy appearing rash to L forearm, R dorsum of hand, and R cheek  Assessment and Plan:   Diagnoses and all orders for this visit:  Plant allergic contact dermatitis No red flags. Will trial oral prednisone. Continue antihistamine. Triamcinolone prn. Worsening precautions advised.  Other orders -     predniSONE (DELTASONE) 20 MG tablet; Take 1 tablet (20 mg total) by mouth daily with breakfast for 7 days. -     triamcinolone (KENALOG) 0.025 % cream; Apply to affected area 1-2 times daily   . Reviewed expectations re: course of current medical issues. . Discussed self-management of symptoms. . Outlined signs and symptoms indicating need for more acute intervention. . Patient verbalized understanding and all questions were answered. Marland Kitchen Health Maintenance issues including appropriate healthy diet, exercise, and smoking avoidance were discussed with patient. . See orders for this visit  as documented in the electronic medical record.  I discussed the assessment and treatment plan with the patient. The patient was provided an opportunity to ask questions and all were answered. The patient agreed with the plan and demonstrated an understanding of the instructions.   The  patient was advised to call back or seek an in-person evaluation if the symptoms worsen or if the condition fails to improve as anticipated.    [place] SecaSamantha Henli Hey, [place] 09/07/2018

## 2019-06-12 ENCOUNTER — Encounter: Payer: Commercial Managed Care - PPO | Admitting: Physician Assistant

## 2019-06-12 DIAGNOSIS — Z0289 Encounter for other administrative examinations: Secondary | ICD-10-CM

## 2019-06-12 NOTE — Progress Notes (Deleted)
I acted as a Neurosurgeon for [employer], PA-C [employer], LPN  Subjective:    Hannah Donaldson is a 24 y.o. female and is here for a comprehensive physical exam.  HPI  Health Maintenance Due  Topic Date Due  . TETANUS/TDAP  09/22/2018    Acute Concerns:   Chronic Issues:   Health Maintenance: Immunizations -- UTD, Needs Tdap Colonoscopy -- N/A Mammogram -- N/A PAP -- UTD, due 2023 Bone Density -- N/A Diet -- *** Caffeine intake -- *** Sleep habits -- *** Exercise -- *** Current Weight --    Weight History: Wt Readings from Last 10 Encounters:  06/09/18 123 lb 8 oz (56 kg)  03/29/17 123 lb (55.8 kg)  07/06/16 123 lb 9.6 oz (56.1 kg)  06/22/16 120 lb (54.4 kg)  06/18/16 120 lb 6.1 oz (54.6 kg)   Mood -- *** No LMP recorded. Period characteristics -- *** Birth control -- ***  Depression screen PHQ 2/9 06/09/2018  Decreased Interest 0  Down, Depressed, Hopeless 0  PHQ - 2 Score 0     Other providers/specialists: Patient Care Team: Jarold Motto, [place] as PCP - General (Physician Assistant) Tracey Harries, MD as Consulting Physician (Obstetrics and Gynecology)   PMHx, SurgHx, SocialHx, Medications, and Allergies were reviewed in the Visit Navigator and updated as appropriate.   Past Medical History:  Diagnosis Date  . Allergy    Seasonal  . Pyelonephritis   . UTI (urinary tract infection)     No past surgical history on file.  Family History  Problem Relation Age of Onset  . Diabetes Mother   . Other Sister        pre-diabetes  . Hypertension Maternal Grandmother   . Diabetes Maternal Grandmother   . Other Maternal Grandfather        aneurysm  . Breast cancer Neg Hx   . Colon cancer Neg Hx     Social History   Tobacco Use  . Smoking status: Never Smoker  . Smokeless tobacco: Never Used  Substance Use Topics  . Alcohol use: Yes    Alcohol/week: 1.0 - 2.0 standard drinks    Types: 1 - 2 Shots of liquor per week    Comment: once  a month  . Drug use: No    Review of Systems:   ROS  Objective:   There were no vitals taken for this visit.  General Appearance:    Alert, cooperative, no distress, appears stated age  Head:    Normocephalic, without obvious abnormality, atraumatic  Eyes:    PERRL, conjunctiva/corneas clear, EOM's intact, fundi    benign, both eyes  Ears:    Normal TM's and external ear canals, both ears  Nose:   Nares normal, septum midline, mucosa normal, no drainage    or sinus tenderness  Throat:   Lips, mucosa, and tongue normal; teeth and gums normal  Neck:   Supple, symmetrical, trachea midline, no adenopathy;    thyroid:  no enlargement/tenderness/nodules; no carotid   bruit or JVD  Back:     Symmetric, no curvature, ROM normal, no CVA tenderness  Lungs:     Clear to auscultation bilaterally, respirations unlabored  Chest Wall:    No tenderness or deformity   Heart:    Regular rate and rhythm, S1 and S2 normal, no murmur, rub   or gallop  Breast Exam:    No tenderness, masses, or nipple abnormality  Abdomen:     Soft, non-tender, bowel sounds active  all four quadrants,    no masses, no organomegaly  Genitalia:    Normal female without lesion, discharge or tenderness  Rectal:    Normal tone no masses or tenderness  Extremities:   Extremities normal, atraumatic, no cyanosis or edema  Pulses:   2+ and symmetric all extremities  Skin:   Skin color, texture, turgor normal, no rashes or lesions  Lymph nodes:   Cervical, supraclavicular, and axillary nodes normal  Neurologic:   CNII-XII intact, normal strength, sensation and reflexes    throughout   Results for orders placed or performed in visit on 06/22/18  Chlamydia/Gonococcus/Trichomonas, NAA  Result Value Ref Range   Chlamydia by NAA Negative    Neisseria gonorrhoeae, NAA Negative    Trichomonas Negative   HM PAP SMEAR  Result Value Ref Range   HM Pap smear NILM     Assessment/Plan:   There are no diagnoses linked to this  encounter.  Well Adult Exam: Labs ordered: {Yes/No:18319::"Yes"}. Patient counseling was done. See below for items discussed. Discussed the patient's BMI. The {BMI plan (MU NQF measure 421):19504} Follow up {follow-up interval:13814}.  Patient Counseling:   [x]     Nutrition: Stressed importance of moderation in sodium/caffeine intake, saturated fat and cholesterol, caloric balance, sufficient intake of fresh fruits, vegetables, fiber, calcium, iron, and 1 mg of folate supplement per day (for females capable of pregnancy).   [x]      Stressed the importance of regular exercise.    [x]     Substance Abuse: Discussed cessation/primary prevention of tobacco, alcohol, or other drug use; driving or other dangerous activities under the influence; availability of treatment for abuse.    [x]      Injury prevention: Discussed safety belts, safety helmets, smoke detector, smoking near bedding or upholstery.    [x]      Sexuality: Discussed sexually transmitted diseases, partner selection, use of condoms, avoidance of unintended pregnancy  and contraceptive alternatives.    [x]     Dental health: Discussed importance of regular tooth brushing, flossing, and dental visits.   [x]      Health maintenance and immunizations reviewed. Please refer to Health maintenance section.   ***  Inda Coke, PA-C [place]

## 2019-06-16 ENCOUNTER — Other Ambulatory Visit: Payer: Self-pay

## 2019-06-19 ENCOUNTER — Ambulatory Visit (INDEPENDENT_AMBULATORY_CARE_PROVIDER_SITE_OTHER): Payer: Commercial Managed Care - PPO | Admitting: Physician Assistant

## 2019-06-19 ENCOUNTER — Encounter: Payer: Self-pay | Admitting: Physician Assistant

## 2019-06-19 VITALS — BP 100/70 | HR 81 | Temp 98.2°F | Ht 61.5 in | Wt 124.5 lb

## 2019-06-19 DIAGNOSIS — J45991 Cough variant asthma: Secondary | ICD-10-CM

## 2019-06-19 DIAGNOSIS — Z136 Encounter for screening for cardiovascular disorders: Secondary | ICD-10-CM | POA: Diagnosis not present

## 2019-06-19 DIAGNOSIS — F419 Anxiety disorder, unspecified: Secondary | ICD-10-CM

## 2019-06-19 DIAGNOSIS — Z0000 Encounter for general adult medical examination without abnormal findings: Secondary | ICD-10-CM

## 2019-06-19 DIAGNOSIS — Z23 Encounter for immunization: Secondary | ICD-10-CM | POA: Diagnosis not present

## 2019-06-19 DIAGNOSIS — Z13220 Encounter for screening for lipoid disorders: Secondary | ICD-10-CM

## 2019-06-19 LAB — CBC WITH DIFFERENTIAL/PLATELET
Basophils Absolute: 0 10*3/uL (ref 0.0–0.1)
Basophils Relative: 0.6 % (ref 0.0–3.0)
Eosinophils Absolute: 0.1 10*3/uL (ref 0.0–0.7)
Eosinophils Relative: 2.1 % (ref 0.0–5.0)
HCT: 37.1 % (ref 36.0–46.0)
Hemoglobin: 12.1 g/dL (ref 12.0–15.0)
Lymphocytes Relative: 44.7 % (ref 12.0–46.0)
Lymphs Abs: 3 10*3/uL (ref 0.7–4.0)
MCHC: 32.5 g/dL (ref 30.0–36.0)
MCV: 90 fl (ref 78.0–100.0)
Monocytes Absolute: 0.4 10*3/uL (ref 0.1–1.0)
Monocytes Relative: 6.7 % (ref 3.0–12.0)
Neutro Abs: 3.1 10*3/uL (ref 1.4–7.7)
Neutrophils Relative %: 45.9 % (ref 43.0–77.0)
Platelets: 221 10*3/uL (ref 150.0–400.0)
RBC: 4.12 Mil/uL (ref 3.87–5.11)
RDW: 13.9 % (ref 11.5–15.5)
WBC: 6.7 10*3/uL (ref 4.0–10.5)

## 2019-06-19 LAB — COMPREHENSIVE METABOLIC PANEL
ALT: 12 U/L (ref 0–35)
AST: 14 U/L (ref 0–37)
Albumin: 3.7 g/dL (ref 3.5–5.2)
Alkaline Phosphatase: 38 U/L — ABNORMAL LOW (ref 39–117)
BUN: 11 mg/dL (ref 6–23)
CO2: 24 mEq/L (ref 19–32)
Calcium: 9 mg/dL (ref 8.4–10.5)
Chloride: 107 mEq/L (ref 96–112)
Creatinine, Ser: 0.51 mg/dL (ref 0.40–1.20)
GFR: 147.32 mL/min (ref >60.00)
Glucose, Bld: 96 mg/dL (ref 70–99)
Potassium: 4.3 mEq/L (ref 3.5–5.1)
Sodium: 139 mEq/L (ref 135–145)
Total Bilirubin: 0.7 mg/dL (ref 0.2–1.2)
Total Protein: 6.8 g/dL (ref 6.0–8.3)

## 2019-06-19 LAB — LIPID PANEL
Cholesterol: 173 mg/dL (ref 0–200)
HDL: 56 mg/dL (ref >39.00)
LDL Cholesterol: 100 mg/dL — ABNORMAL HIGH (ref 0–99)
NonHDL: 117.01
Total CHOL/HDL Ratio: 3
Triglycerides: 86 mg/dL (ref 0.0–149.0)
VLDL: 17.2 mg/dL (ref 0.0–40.0)

## 2019-06-19 NOTE — Addendum Note (Signed)
Addended by: Jimmye Norman on: 06/19/2019 12:11 PM   Modules accepted: Orders

## 2019-06-19 NOTE — Patient Instructions (Signed)
It was great to see you!  Please go to the lab for blood work.   Our office will call you with your results unless you have chosen to receive results via MyChart.  If your blood work is normal we will follow-up each year for physicals and as scheduled for chronic medical problems.  If anything is abnormal we will treat accordingly and get you in for a follow-up.  Take care,  Hannah Donaldson    Health Maintenance, Female Adopting a healthy lifestyle and getting preventive care are important in promoting health and wellness. Ask your health care provider about:  The right schedule for you to have regular tests and exams.  Things you can do on your own to prevent diseases and keep yourself healthy. What should I know about diet, weight, and exercise? Eat a healthy diet   Eat a diet that includes plenty of vegetables, fruits, low-fat dairy products, and lean protein.  Do not eat a lot of foods that are high in solid fats, added sugars, or sodium. Maintain a healthy weight Body mass index (BMI) is used to identify weight problems. It estimates body fat based on height and weight. Your health care provider can help determine your BMI and help you achieve or maintain a healthy weight. Get regular exercise Get regular exercise. This is one of the most important things you can do for your health. Most adults should:  Exercise for at least 150 minutes each week. The exercise should increase your heart rate and make you sweat (moderate-intensity exercise).  Do strengthening exercises at least twice a week. This is in addition to the moderate-intensity exercise.  Spend less time sitting. Even light physical activity can be beneficial. Watch cholesterol and blood lipids Have your blood tested for lipids and cholesterol at 25 years of age, then have this test every 5 years. Have your cholesterol levels checked more often if:  Your lipid or cholesterol levels are high.  You are older than 25  years of age.  You are at high risk for heart disease. What should I know about cancer screening? Depending on your health history and family history, you may need to have cancer screening at various ages. This may include screening for:  Breast cancer.  Cervical cancer.  Colorectal cancer.  Skin cancer.  Lung cancer. What should I know about heart disease, diabetes, and high blood pressure? Blood pressure and heart disease  High blood pressure causes heart disease and increases the risk of stroke. This is more likely to develop in people who have high blood pressure readings, are of African descent, or are overweight.  Have your blood pressure checked: ? Every 3-5 years if you are 18-39 years of age. ? Every year if you are 40 years old or older. Diabetes Have regular diabetes screenings. This checks your fasting blood sugar level. Have the screening done:  Once every three years after age 40 if you are at a normal weight and have a low risk for diabetes.  More often and at a younger age if you are overweight or have a high risk for diabetes. What should I know about preventing infection? Hepatitis B If you have a higher risk for hepatitis B, you should be screened for this virus. Talk with your health care provider to find out if you are at risk for hepatitis B infection. Hepatitis C Testing is recommended for:  Everyone born from 1945 through 1965.  Anyone with known risk factors for hepatitis C. Sexually   transmitted infections (STIs)  Get screened for STIs, including gonorrhea and chlamydia, if: ? You are sexually active and are younger than 24 years of age. ? You are older than 24 years of age and your health care provider tells you that you are at risk for this type of infection. ? Your sexual activity has changed since you were last screened, and you are at increased risk for chlamydia or gonorrhea. Ask your health care provider if you are at risk.  Ask your health  care provider about whether you are at high risk for HIV. Your health care provider may recommend a prescription medicine to help prevent HIV infection. If you choose to take medicine to prevent HIV, you should first get tested for HIV. You should then be tested every 3 months for as long as you are taking the medicine. Pregnancy  If you are about to stop having your period (premenopausal) and you may become pregnant, seek counseling before you get pregnant.  Take 400 to 800 micrograms (mcg) of folic acid every day if you become pregnant.  Ask for birth control (contraception) if you want to prevent pregnancy. Osteoporosis and menopause Osteoporosis is a disease in which the bones lose minerals and strength with aging. This can result in bone fractures. If you are 65 years old or older, or if you are at risk for osteoporosis and fractures, ask your health care provider if you should:  Be screened for bone loss.  Take a calcium or vitamin D supplement to lower your risk of fractures.  Be given hormone replacement therapy (HRT) to treat symptoms of menopause. Follow these instructions at home: Lifestyle  Do not use any products that contain nicotine or tobacco, such as cigarettes, e-cigarettes, and chewing tobacco. If you need help quitting, ask your health care provider.  Do not use street drugs.  Do not share needles.  Ask your health care provider for help if you need support or information about quitting drugs. Alcohol use  Do not drink alcohol if: ? Your health care provider tells you not to drink. ? You are pregnant, may be pregnant, or are planning to become pregnant.  If you drink alcohol: ? Limit how much you use to 0-1 drink a day. ? Limit intake if you are breastfeeding.  Be aware of how much alcohol is in your drink. In the [place] one drink equals one 12 oz bottle of beer (355 mL), one 5 oz glass of wine (148 mL), or one 1 oz glass of hard liquor (44 mL). General  instructions  Schedule regular health, dental, and eye exams.  Stay current with your vaccines.  Tell your health care provider if: ? You often feel depressed. ? You have ever been abused or do not feel safe at home. Summary  Adopting a healthy lifestyle and getting preventive care are important in promoting health and wellness.  Follow your health care provider's instructions about healthy diet, exercising, and getting tested or screened for diseases.  Follow your health care provider's instructions on monitoring your cholesterol and blood pressure. This information is not intended to replace advice given to you by your health care provider. Make sure you discuss any questions you have with your health care provider. Document Revised: 04/27/2018 Document Reviewed: 04/27/2018 Elsevier Patient Education  2020 [employer]  

## 2019-06-19 NOTE — Progress Notes (Signed)
I acted as a Education administrator for [employer], PA-C [employer], LPN  Subjective:    Hannah Donaldson is a 24 y.o. female and is here for a comprehensive physical exam.  HPI  Health Maintenance Due  Topic Date Due  . TETANUS/TDAP  09/22/2018    Acute Concerns: None  Chronic Issues: Anxiety -- currently on buspar 5 mg daily; taking this less than daily. Is effective when she takes it. She is wondering if her birth control is making her more irritable at times. Denies depression, SI/HI. 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 Takes antihistamines regularly, feels like overall she is well controlled.  Health Maintenance: Immunizations -- UTD, will give Tdap today Colonoscopy -- N/A Mammogram -- N/A PAP -- UTD, due 05/2021 Bone Density -- N/A Diet -- less cooking at home 2/2 new second job Sleep habits -- no significant issues Exercise -- very active job Current Weight -- Weight: 124 lb 8 oz (56.5 kg)  Weight History: Wt Readings from Last 10 Encounters:  06/19/19 124 lb 8 oz (56.5 kg)  06/09/18 123 lb 8 oz (56 kg)  03/29/17 123 lb (55.8 kg)  07/06/16 123 lb 9.6 oz (56.1 kg)  06/22/16 120 lb (54.4 kg)  06/18/16 120 lb 6.1 oz (54.6 kg)  Mood -- well controlled with medication, irritable at times but not significant per patient Patient's last menstrual period was 06/06/2019. Birth control -- NuvaRing  Depression screen PHQ 2/9 06/19/2019  Decreased Interest 0  Down, Depressed, Hopeless 0  PHQ - 2 Score 0   UTD with dentist  Other providers/specialists: Patient Care Team: Inda Coke, [place] as PCP - General (Physician Assistant) Newton Pigg, MD as Consulting Physician (Obstetrics and Gynecology)   PMHx, SurgHx, SocialHx, Medications, and Allergies were reviewed in the Visit Navigator and updated as appropriate.   Past Medical History:  Diagnosis Date  . Allergy    Seasonal  . Pyelonephritis   . UTI (urinary tract infection)      History reviewed. No pertinent surgical history.   Family History  Problem Relation Age of Onset  . Diabetes Mother   . Other Sister        pre-diabetes  . Hypertension Maternal Grandmother   . Diabetes Maternal Grandmother   . Other Maternal Grandfather        aneurysm  . Breast cancer Neg Hx   . Colon cancer Neg Hx     Social History   Tobacco Use  . Smoking status: Never Smoker  . Smokeless tobacco: Never Used  Substance Use Topics  . Alcohol use: Yes    Alcohol/week: 1.0 - 2.0 standard drinks    Types: 1 - 2 Shots of liquor per week    Comment: once a month  . Drug use: No    Review of Systems:   Review of Systems  Constitutional: Negative for chills, fever, malaise/fatigue and weight loss.  HENT: Negative for hearing loss, sinus pain and sore throat.   Respiratory: Negative for cough and hemoptysis.   Cardiovascular: Negative for chest pain, palpitations, leg swelling and PND.  Gastrointestinal: Negative for abdominal pain, constipation, diarrhea, heartburn, nausea and vomiting.  Genitourinary: Negative for dysuria, frequency and urgency.  Musculoskeletal: Negative for back pain, myalgias and neck pain.  Skin: Negative for itching and rash.  Neurological: Negative for dizziness, tingling, seizures and headaches.  Endo/Heme/Allergies: Negative for polydipsia.  Psychiatric/Behavioral: Negative for depression. The patient is not nervous/anxious.     Objective:  BP 100/70 (BP Location: Left Arm, Patient Position: Sitting, Cuff Size: Normal)   Pulse 81   Temp 98.2 F (36.8 C) (Temporal)   Ht 5' 1.5" (1.562 m)   Wt 124 lb 8 oz (56.5 kg)   LMP 06/06/2019   SpO2 96%   BMI 23.14 kg/m   General Appearance:    Alert, cooperative, no distress, appears stated age  Head:    Normocephalic, without obvious abnormality, atraumatic  Eyes:    PERRL, conjunctiva/corneas clear, EOM's intact, fundi    benign, both eyes  Ears:    Normal TM's and external ear canals,  both ears  Nose:   Nares normal, septum midline, mucosa normal, no drainage    or sinus tenderness  Throat:   Lips, mucosa, and tongue normal; teeth and gums normal  Neck:   Supple, symmetrical, trachea midline, no adenopathy;    thyroid:  no enlargement/tenderness/nodules; no carotid   bruit or JVD  Back:     Symmetric, no curvature, ROM normal, no CVA tenderness  Lungs:     Clear to auscultation bilaterally, respirations unlabored  Chest Wall:    No tenderness or deformity   Heart:    Regular rate and rhythm, S1 and S2 normal, no murmur, rub   or gallop  Breast Exam:    Deferred  Abdomen:     Soft, non-tender, bowel sounds active all four quadrants,    no masses, no organomegaly  Genitalia:    Deferred  Rectal:    Deferred  Extremities:   Extremities normal, atraumatic, no cyanosis or edema  Pulses:   2+ and symmetric all extremities  Skin:   Skin color, texture, turgor normal, no rashes or lesions  Lymph nodes:   Cervical, supraclavicular, and axillary nodes normal  Neurologic:   CNII-XII intact, normal strength, sensation and reflexes    throughout     Assessment/Plan:   Hannah Donaldson was seen today for annual exam.  Diagnoses and all orders for this visit:  Routine physical examination Today patient counseled on age appropriate routine health concerns for screening and prevention, each reviewed and up to date or declined. Immunizations reviewed and up to date or declined. Labs ordered and reviewed. Risk factors for depression reviewed and negative. Hearing function and visual acuity are intact. ADLs screened and addressed as needed. Functional ability and level of safety reviewed and appropriate. Education, counseling and referrals performed based on assessed risks today. Patient provided with a copy of personalized plan for preventive services. -     CBC with Differential/Platelet -     Comprehensive metabolic panel  Encounter for lipid screening for cardiovascular disease -      Lipid panel  Cough variant asthma vs uacs  She states that her symptoms are overall well controlled, denies need for further evaluation/intervention.  Anxiety Overall well controlled with Buspar, per patient report. Denies SI/HI.  Well Adult Exam: Labs ordered: Yes. Patient counseling was done. See below for items discussed. Discussed the patient's BMI. The BMI is in the acceptable range Follow up as needed for acute illness.  Patient Counseling:   [x]     Nutrition: Stressed importance of moderation in sodium/caffeine intake, saturated fat and cholesterol, caloric balance, sufficient intake of fresh fruits, vegetables, fiber, calcium, iron, and 1 mg of folate supplement per day (for females capable of pregnancy).   [x]      Stressed the importance of regular exercise.    [x]     Substance Abuse: Discussed cessation/primary prevention of tobacco, alcohol,  or other drug use; driving or other dangerous activities under the influence; availability of treatment for abuse.    [x]      Injury prevention: Discussed safety belts, safety helmets, smoke detector, smoking near bedding or upholstery.    [x]      Sexuality: Discussed sexually transmitted diseases, partner selection, use of condoms, avoidance of unintended pregnancy  and contraceptive alternatives.    [x]     Dental health: Discussed importance of regular tooth brushing, flossing, and dental visits.   [x]      Health maintenance and immunizations reviewed. Please refer to Health maintenance section.   CMA or LPN served as scribe during this visit. History, Physical, and Plan performed by medical provider. The above documentation has been reviewed and is accurate and complete.  , PA-C [employer] [facility]

## 2020-01-19 ENCOUNTER — Telehealth: Payer: Self-pay

## 2020-01-19 DIAGNOSIS — F419 Anxiety disorder, unspecified: Secondary | ICD-10-CM

## 2020-01-19 DIAGNOSIS — G4700 Insomnia, unspecified: Secondary | ICD-10-CM

## 2020-01-19 NOTE — Telephone Encounter (Signed)
Spoke to pt told her I am unable to upload things to My Chart. If you want I can mail a list to you or you can come by and pick it up. Pt verbalized understanding and said mail it. Told her okay what kind of therapist are you wanting. Pt said she is having trouble sleeping and increase in anxiety and [name] said if got worse to see a Therapist. Told her okay I can place a referral for Psychologist and someone will contact you for an appt. Pt said that would be fine. Told her okay will place referral. Pt verbalized understanding. Referral put in Epic.

## 2020-01-19 NOTE — Telephone Encounter (Signed)
Pt is requesting a list of therapists sent to her my chart

## 2020-01-23 ENCOUNTER — Ambulatory Visit (INDEPENDENT_AMBULATORY_CARE_PROVIDER_SITE_OTHER): Payer: 59 | Admitting: Psychology

## 2020-01-25 DIAGNOSIS — F411 Generalized anxiety disorder: Secondary | ICD-10-CM | POA: Diagnosis not present

## 2020-02-02 ENCOUNTER — Other Ambulatory Visit: Payer: Self-pay

## 2020-02-02 ENCOUNTER — Other Ambulatory Visit: Payer: 59

## 2020-02-02 DIAGNOSIS — Z20822 Contact with and (suspected) exposure to covid-19: Secondary | ICD-10-CM

## 2020-02-05 ENCOUNTER — Ambulatory Visit (INDEPENDENT_AMBULATORY_CARE_PROVIDER_SITE_OTHER): Payer: 59 | Admitting: Psychology

## 2020-02-05 DIAGNOSIS — F411 Generalized anxiety disorder: Secondary | ICD-10-CM

## 2020-02-05 LAB — NOVEL CORONAVIRUS, NAA: SARS-CoV-2, NAA: NOT DETECTED

## 2020-02-26 ENCOUNTER — Ambulatory Visit (INDEPENDENT_AMBULATORY_CARE_PROVIDER_SITE_OTHER): Payer: 59 | Admitting: Psychology

## 2020-02-26 DIAGNOSIS — F411 Generalized anxiety disorder: Secondary | ICD-10-CM

## 2020-03-11 ENCOUNTER — Ambulatory Visit (INDEPENDENT_AMBULATORY_CARE_PROVIDER_SITE_OTHER): Payer: 59 | Admitting: Psychology

## 2020-03-11 DIAGNOSIS — F411 Generalized anxiety disorder: Secondary | ICD-10-CM | POA: Diagnosis not present

## 2020-05-20 ENCOUNTER — Ambulatory Visit: Payer: 59 | Admitting: Psychology

## 2022-11-03 LAB — HM PAP SMEAR

## 2023-11-22 ENCOUNTER — Encounter: Payer: Self-pay | Admitting: Physician Assistant

## 2024-06-18 ENCOUNTER — Telehealth: Payer: Self-pay | Admitting: Physician Assistant

## 2024-06-18 NOTE — Telephone Encounter (Signed)
 Unable to LVM due to patient hanging up on me. Needs to r/s appt from 06/19/24 due to weather

## 2024-06-19 ENCOUNTER — Ambulatory Visit: Admitting: Family

## 2024-06-28 ENCOUNTER — Ambulatory Visit: Admitting: Family
